# Patient Record
Sex: Female | Born: 1980 | Hispanic: Yes | Marital: Married | Smoking: Never smoker
Health system: Southern US, Community
[De-identification: ages and names within clinical notes are randomized; demographics above are authoritative.]

## PROBLEM LIST (undated history)

## (undated) ENCOUNTER — Inpatient Hospital Stay (HOSPITAL_COMMUNITY): Payer: Self-pay

## (undated) DIAGNOSIS — O4100X0 Oligohydramnios, unspecified trimester, not applicable or unspecified: Secondary | ICD-10-CM

## (undated) HISTORY — PX: NO PAST SURGERIES: SHX2092

---

## 2008-10-06 ENCOUNTER — Ambulatory Visit: Payer: Self-pay | Admitting: Obstetrics & Gynecology

## 2008-10-06 LAB — CONVERTED CEMR LAB
AntiThromb III Func: 114 %
Anticardiolipin IgA: 7
Anticardiolipin IgG: <7
Anticardiolipin IgM: <7
Homocysteine: 1.5 umol/L — ABNORMAL LOW
Protein S Ag, Total: 69 % — ABNORMAL LOW

## 2008-10-10 ENCOUNTER — Ambulatory Visit (HOSPITAL_COMMUNITY): Admission: RE | Admit: 2008-10-10 | Discharge: 2008-10-10 | Payer: Self-pay | Admitting: Family Medicine

## 2008-10-20 ENCOUNTER — Ambulatory Visit: Payer: Self-pay | Admitting: Family Medicine

## 2008-11-03 ENCOUNTER — Ambulatory Visit: Payer: Self-pay | Admitting: Obstetrics & Gynecology

## 2008-11-17 ENCOUNTER — Ambulatory Visit: Payer: Self-pay | Admitting: Family Medicine

## 2008-12-01 ENCOUNTER — Ambulatory Visit: Payer: Self-pay | Admitting: Obstetrics & Gynecology

## 2008-12-15 ENCOUNTER — Other Ambulatory Visit: Payer: Self-pay | Admitting: Obstetrics & Gynecology

## 2008-12-22 ENCOUNTER — Ambulatory Visit (HOSPITAL_COMMUNITY): Admission: RE | Admit: 2008-12-22 | Discharge: 2008-12-22 | Payer: Self-pay | Admitting: Obstetrics and Gynecology

## 2008-12-22 ENCOUNTER — Ambulatory Visit: Payer: Self-pay | Admitting: Family Medicine

## 2008-12-22 ENCOUNTER — Encounter: Payer: Self-pay | Admitting: Obstetrics & Gynecology

## 2008-12-22 LAB — CONVERTED CEMR LAB
HCT: 33.5 % — ABNORMAL LOW (ref 36.0–46.0)
MCHC: 34 g/dL (ref 30.0–36.0)
MCV: 91.5 fL (ref 78.0–100.0)
Platelets: 227 10*3/uL (ref 150–400)
RDW: 12.9 % (ref 11.5–15.5)

## 2009-01-05 ENCOUNTER — Ambulatory Visit: Payer: Self-pay | Admitting: Family Medicine

## 2009-01-16 ENCOUNTER — Ambulatory Visit: Payer: Self-pay | Admitting: Obstetrics & Gynecology

## 2009-01-19 ENCOUNTER — Ambulatory Visit: Payer: Self-pay | Admitting: Family Medicine

## 2009-01-23 ENCOUNTER — Ambulatory Visit: Payer: Self-pay | Admitting: Family Medicine

## 2009-01-26 ENCOUNTER — Ambulatory Visit: Payer: Self-pay | Admitting: Obstetrics & Gynecology

## 2009-01-30 ENCOUNTER — Inpatient Hospital Stay (HOSPITAL_COMMUNITY): Admission: AD | Admit: 2009-01-30 | Discharge: 2009-01-30 | Payer: Self-pay | Admitting: Family Medicine

## 2009-01-31 ENCOUNTER — Ambulatory Visit: Payer: Self-pay | Admitting: Obstetrics and Gynecology

## 2009-02-02 ENCOUNTER — Ambulatory Visit: Payer: Self-pay | Admitting: Family Medicine

## 2009-02-06 ENCOUNTER — Ambulatory Visit: Payer: Self-pay | Admitting: Obstetrics & Gynecology

## 2009-02-09 ENCOUNTER — Ambulatory Visit: Payer: Self-pay | Admitting: Family Medicine

## 2009-02-14 ENCOUNTER — Ambulatory Visit: Payer: Self-pay | Admitting: Obstetrics & Gynecology

## 2009-02-16 ENCOUNTER — Ambulatory Visit: Payer: Self-pay | Admitting: Obstetrics & Gynecology

## 2009-02-17 ENCOUNTER — Encounter: Payer: Self-pay | Admitting: Obstetrics & Gynecology

## 2009-02-20 ENCOUNTER — Ambulatory Visit: Payer: Self-pay | Admitting: Obstetrics & Gynecology

## 2009-02-23 ENCOUNTER — Ambulatory Visit: Payer: Self-pay | Admitting: Advanced Practice Midwife

## 2009-02-23 ENCOUNTER — Ambulatory Visit: Payer: Self-pay | Admitting: Obstetrics & Gynecology

## 2009-02-23 ENCOUNTER — Inpatient Hospital Stay (HOSPITAL_COMMUNITY): Admission: AD | Admit: 2009-02-23 | Discharge: 2009-02-25 | Payer: Self-pay | Admitting: Family Medicine

## 2009-02-23 LAB — CONVERTED CEMR LAB
Chlamydia, DNA Probe: NEGATIVE
GC Probe Amp, Genital: NEGATIVE

## 2009-02-24 ENCOUNTER — Encounter: Payer: Self-pay | Admitting: Obstetrics & Gynecology

## 2009-02-24 LAB — CONVERTED CEMR LAB: Clue Cells Wet Prep HPF POC: NONE SEEN

## 2010-12-24 LAB — CBC
Hemoglobin: 12.5 g/dL (ref 12.0–15.0)
RBC: 3.77 MIL/uL — ABNORMAL LOW (ref 3.87–5.11)
RDW: 12.9 % (ref 11.5–15.5)

## 2010-12-24 LAB — POCT URINALYSIS DIP (DEVICE)
Bilirubin Urine: NEGATIVE
Glucose, UA: NEGATIVE mg/dL
Glucose, UA: NEGATIVE mg/dL
Nitrite: NEGATIVE
Nitrite: NEGATIVE
Urobilinogen, UA: 0.2 mg/dL (ref 0.0–1.0)
Urobilinogen, UA: 0.2 mg/dL (ref 0.0–1.0)
pH: 7.5 (ref 5.0–8.0)

## 2010-12-24 LAB — RPR: RPR Ser Ql: NONREACTIVE

## 2010-12-25 LAB — POCT URINALYSIS DIP (DEVICE)
Bilirubin Urine: NEGATIVE
Bilirubin Urine: NEGATIVE
Bilirubin Urine: NEGATIVE
Bilirubin Urine: NEGATIVE
Glucose, UA: NEGATIVE mg/dL
Glucose, UA: NEGATIVE mg/dL
Glucose, UA: NEGATIVE mg/dL
Glucose, UA: NEGATIVE mg/dL
Hgb urine dipstick: NEGATIVE
Hgb urine dipstick: NEGATIVE
Ketones, ur: NEGATIVE mg/dL
Ketones, ur: NEGATIVE mg/dL
Ketones, ur: NEGATIVE mg/dL
Nitrite: NEGATIVE
Nitrite: NEGATIVE
Protein, ur: NEGATIVE mg/dL
Specific Gravity, Urine: 1.015 (ref 1.005–1.030)
Specific Gravity, Urine: 1.02 (ref 1.005–1.030)
pH: 7 (ref 5.0–8.0)

## 2010-12-26 LAB — POCT URINALYSIS DIP (DEVICE)
Bilirubin Urine: NEGATIVE
Bilirubin Urine: NEGATIVE
Glucose, UA: NEGATIVE mg/dL
Hgb urine dipstick: NEGATIVE
Hgb urine dipstick: NEGATIVE
Ketones, ur: NEGATIVE mg/dL
Ketones, ur: NEGATIVE mg/dL
Nitrite: NEGATIVE
Protein, ur: NEGATIVE mg/dL
Protein, ur: NEGATIVE mg/dL
Specific Gravity, Urine: 1.015 (ref 1.005–1.030)
Specific Gravity, Urine: 1.015 (ref 1.005–1.030)
Specific Gravity, Urine: 1.02 (ref 1.005–1.030)
Urobilinogen, UA: 0.2 mg/dL (ref 0.0–1.0)
pH: 8.5 — ABNORMAL HIGH (ref 5.0–8.0)
pH: 6 (ref 5.0–8.0)

## 2010-12-27 LAB — POCT URINALYSIS DIP (DEVICE)
Bilirubin Urine: NEGATIVE
Bilirubin Urine: NEGATIVE
Glucose, UA: NEGATIVE mg/dL
Hgb urine dipstick: NEGATIVE
Hgb urine dipstick: NEGATIVE
Ketones, ur: NEGATIVE mg/dL
Nitrite: NEGATIVE
Protein, ur: NEGATIVE mg/dL
Specific Gravity, Urine: 1.015 (ref 1.005–1.030)
pH: 8.5 — ABNORMAL HIGH (ref 5.0–8.0)
pH: 7 (ref 5.0–8.0)

## 2010-12-31 LAB — POCT URINALYSIS DIP (DEVICE)
Hgb urine dipstick: NEGATIVE
Ketones, ur: NEGATIVE mg/dL
Protein, ur: 30 mg/dL — AB
Specific Gravity, Urine: 1.015 (ref 1.005–1.030)
Urobilinogen, UA: 0.2 mg/dL (ref 0.0–1.0)
pH: 8.5 — ABNORMAL HIGH (ref 5.0–8.0)

## 2011-01-01 LAB — POCT URINALYSIS DIP (DEVICE)
Bilirubin Urine: NEGATIVE
Hgb urine dipstick: NEGATIVE
Hgb urine dipstick: NEGATIVE
Nitrite: NEGATIVE
Nitrite: NEGATIVE
Protein, ur: NEGATIVE mg/dL
Protein, ur: 30 mg/dL — AB
Specific Gravity, Urine: 1.02 (ref 1.005–1.030)
Urobilinogen, UA: 0.2 mg/dL (ref 0.0–1.0)
Urobilinogen, UA: 1 mg/dL (ref 0.0–1.0)
pH: 8.5 — ABNORMAL HIGH (ref 5.0–8.0)
pH: 8.5 — ABNORMAL HIGH (ref 5.0–8.0)

## 2011-01-03 IMAGING — US US OB FOLLOW-UP
1 series · 14 of 28 positions shown · non-contrast
Comparison: none

OBSTETRICAL ULTRASOUND:
 This ultrasound exam was performed in the [HOSPITAL] Ultrasound Department.  The OB US report was generated in the AS system, and faxed to the ordering physician.  This report is also available in [REDACTED] PACS.

[Series 1: us ob follow up · 14 of 49 slices shown]
[im 2/49]
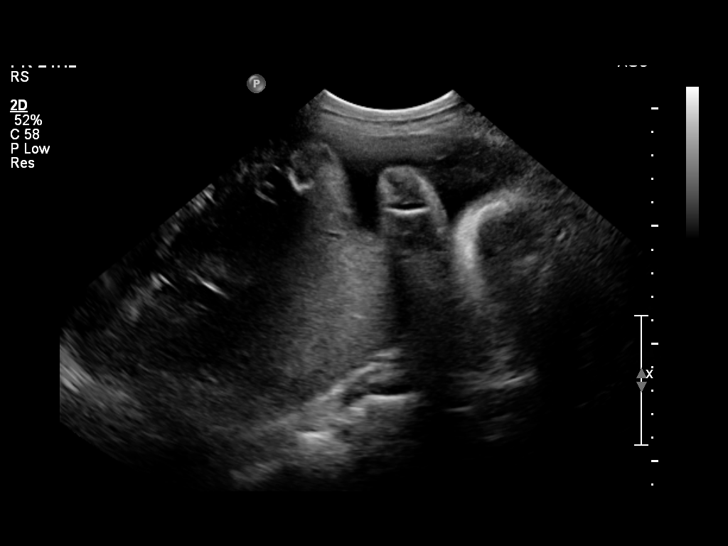
[im 6/49]
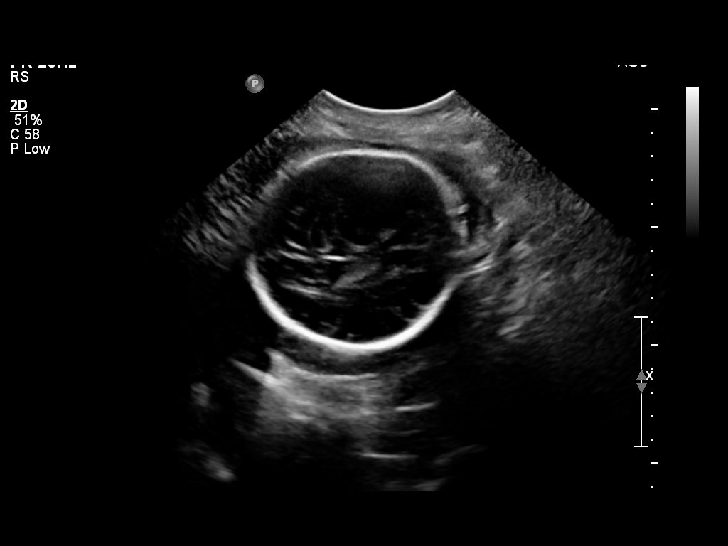
[im 9/49]
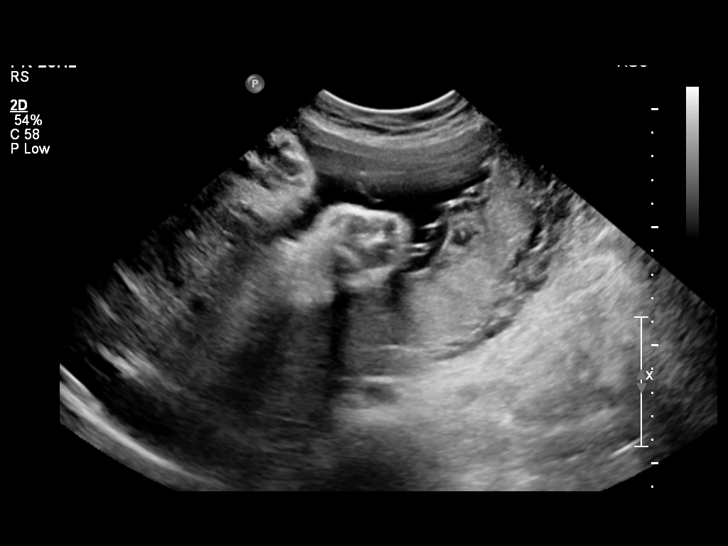
[im 13/49]
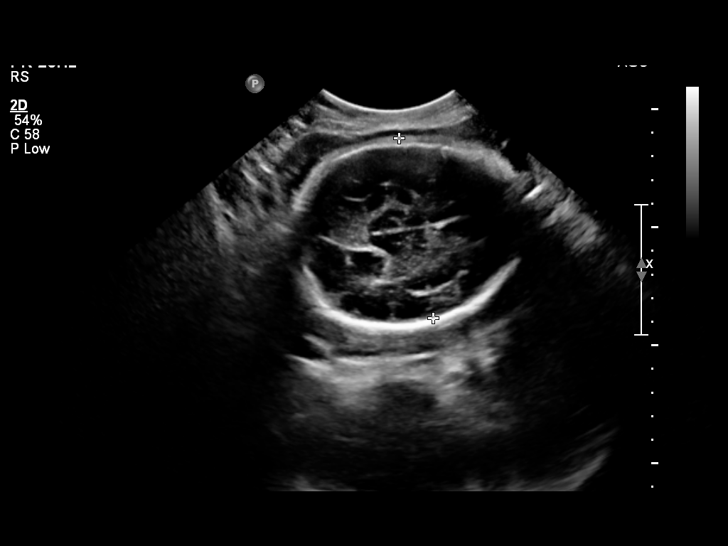
[im 17/49]
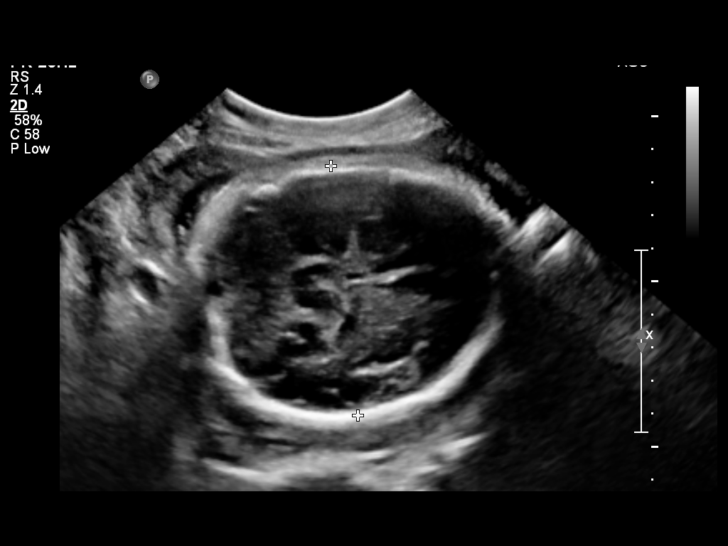
[im 20/49]
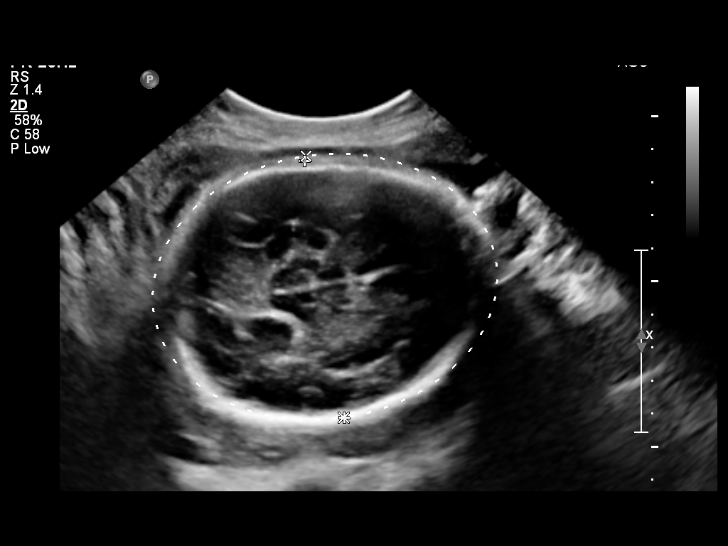
[im 24/49]
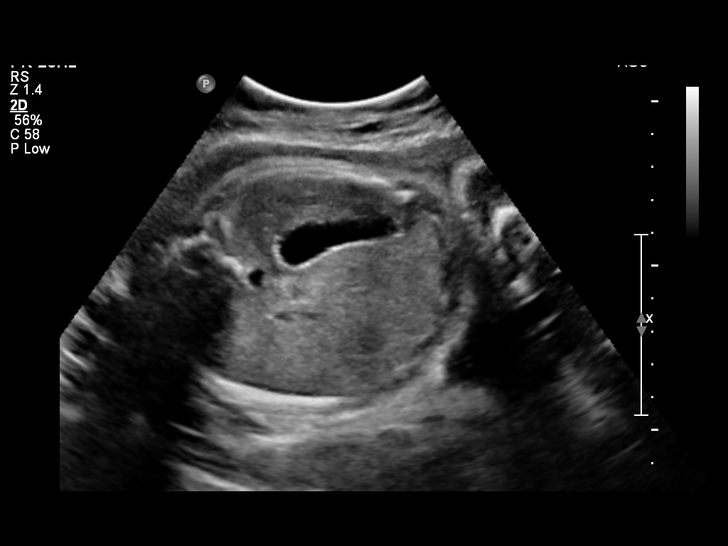
[im 27/49]
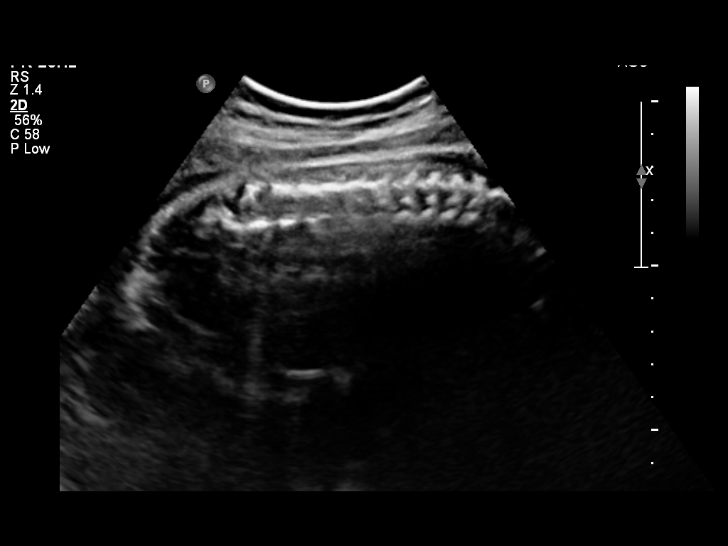
[im 31/49]
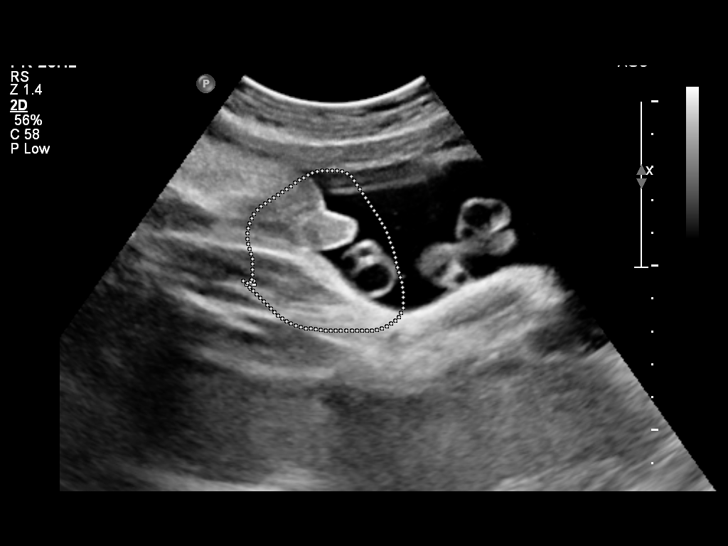
[im 34/49]
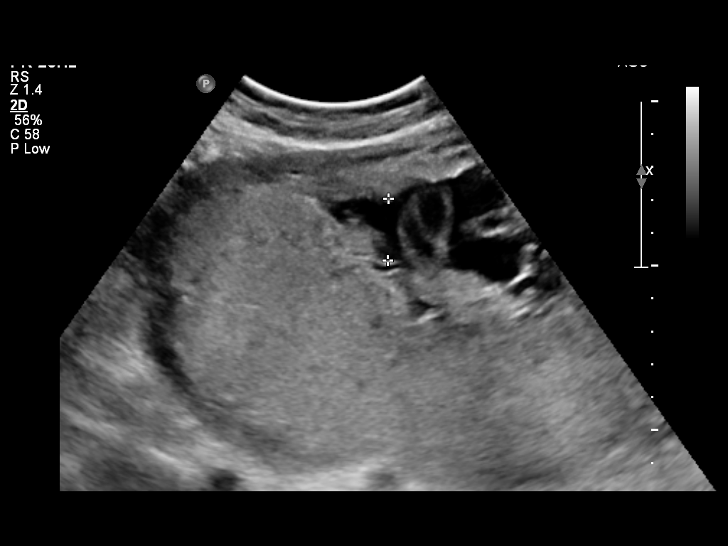
[im 38/49]
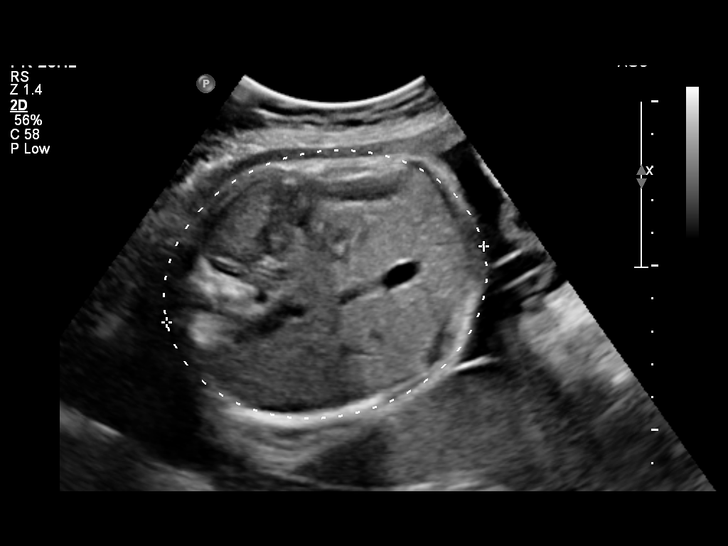
[im 41/49]
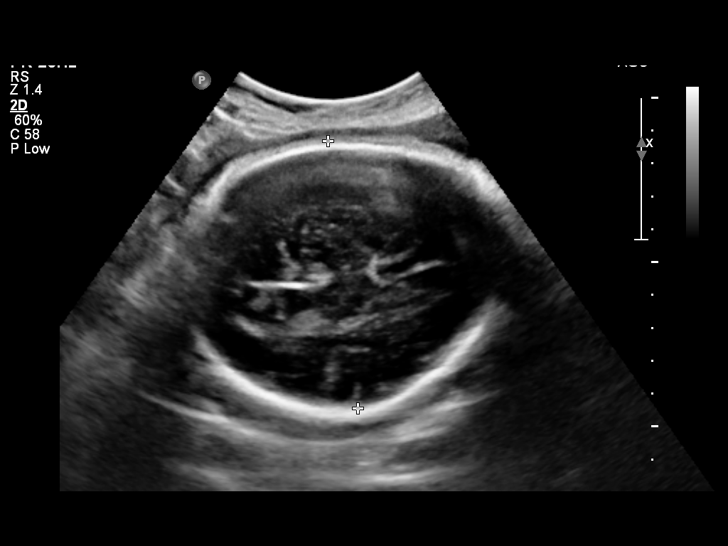
[im 45/49]
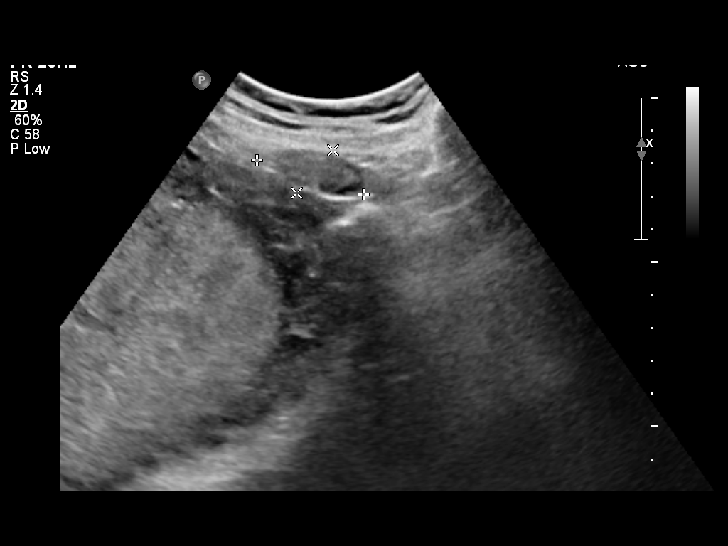
[im 49/49]
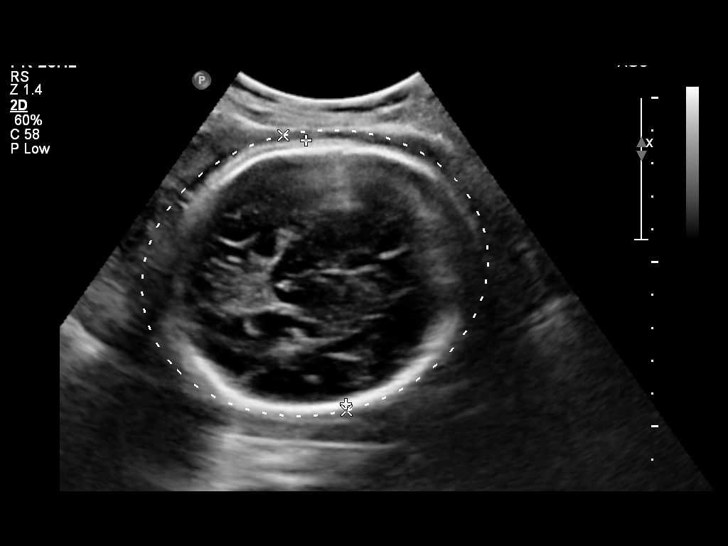

[14 of 28 positions shown; findings below may reference images not displayed]

IMPRESSION: See AS Obstetric US report.

## 2013-09-16 NOTE — L&D Delivery Note (Signed)
Delivery Note At 3:15 AM a healthy female was delivered via Vaginal, Spontaneous Delivery (Presentation: ;  ).  APGAR: 9, 9; weight .   Placenta status: Intact, Spontaneous.  Cord: 3 vessels with the following complications: None.  Cord pH: not obtained  Anesthesia: Local  Episiotomy: None Lacerations: Right periuretheral  Suture Repair: vicryl rapide Est. Blood Loss (mL): 250   Upon arrival patient was complete and pushing. She pushed with good maternal effort to deliver a healthy boy. Mild shoulder dystocia noted with McRoberts only to aid delivery.  Baby was noted to have good tone and placed on maternal abdomen for drying and stimulation. Delayed cord clamping performed and cut by FOB. Placenta delivered intact with 3V cord. Vaginal canal and perineum was inspected and superficial right periurethral laceration was found and repaired in the usual fashion by Dr Gayla DossJoyner which was hemostatic on completion. Pitocin was started and uterus massaged until bleeding slowed. Counts of sharps, instruments, and lap pads were all correct.    Mom to postpartum.  Baby to Couplet care / Skin to Skin.  [name], James 05/03/2014, 3:43 AM  I was present for this delivery and agree with the above resident's note.  Sharen CounterLisa [name] Certified Nurse-Midwife

## 2014-01-19 ENCOUNTER — Encounter: Payer: Self-pay | Admitting: *Deleted

## 2014-01-31 ENCOUNTER — Ambulatory Visit (INDEPENDENT_AMBULATORY_CARE_PROVIDER_SITE_OTHER): Payer: Self-pay | Admitting: Obstetrics & Gynecology

## 2014-01-31 ENCOUNTER — Encounter: Payer: Self-pay | Admitting: Obstetrics & Gynecology

## 2014-01-31 VITALS — BP 107/70 | HR 78 | Temp 96.9°F | Ht 62.0 in | Wt 128.1 lb

## 2014-01-31 DIAGNOSIS — O09529 Supervision of elderly multigravida, unspecified trimester: Secondary | ICD-10-CM

## 2014-01-31 DIAGNOSIS — O09299 Supervision of pregnancy with other poor reproductive or obstetric history, unspecified trimester: Secondary | ICD-10-CM

## 2014-01-31 DIAGNOSIS — O09522 Supervision of elderly multigravida, second trimester: Secondary | ICD-10-CM

## 2014-01-31 DIAGNOSIS — Z8759 Personal history of other complications of pregnancy, childbirth and the puerperium: Secondary | ICD-10-CM | POA: Insufficient documentation

## 2014-01-31 LAB — POCT URINALYSIS DIP (DEVICE)
BILIRUBIN URINE: NEGATIVE
Glucose, UA: NEGATIVE mg/dL
HGB URINE DIPSTICK: NEGATIVE
Ketones, ur: NEGATIVE mg/dL
NITRITE: NEGATIVE
PH: 7.5 (ref 5.0–8.0)
PROTEIN: NEGATIVE mg/dL
Specific Gravity, Urine: 1.02 (ref 1.005–1.030)
Urobilinogen, UA: 0.2 mg/dL (ref 0.0–1.0)

## 2014-01-31 NOTE — Progress Notes (Signed)
Patient reports pelvic pain; had a couple episodes of light bleeding earlier in pregnancy

## 2014-01-31 NOTE — Progress Notes (Signed)
New OB late prenatal care. Pap done  Subjective:late PNC h/o stillbirth    [name] is a [id number] being seen today for her first obstetrical visit.  Her obstetrical history is significant for advanced maternal age and stillbirth first pregnancy. Patient does intend to breast feed. Pregnancy history fully reviewed.  Patient reports no complaints.  Filed Vitals:   01/31/14 1048 01/31/14 1050  BP: 107/70   Pulse: 78   Temp: 96.9 F (36.1 C)   Height:  5\' 2"  (1.575 m)  Weight: 128 lb 1.6 oz (58.106 kg)     HISTORY: OB History  Gravida Para Term Preterm AB SAB TAB Ectopic Multiple Living  3 2 1 1  0 0 0 0 0 1    # Outcome Date GA Lbr Len/2nd Weight Sex Delivery Anes PTL Lv  3 CUR           2 TRM 02/23/09 [id number]  7 lb (3.175 kg) M SVD None  Y  1 PRE 2001 [id number]       SB     Comments: no fluid and nuchal cord     History reviewed. No pertinent past medical history. History reviewed. No pertinent past surgical history. History reviewed. No pertinent family history.   Exam    Uterus:  Fundal Height: 24 cm  Pelvic Exam:    Perineum: Normal Perineum   Vulva: normal   Vagina:  normal mucosa   pH:     Cervix: no lesions   Adnexa: not evaluated   Bony Pelvis: average  System: Breast:  normal appearance, no masses or tenderness   Skin: normal coloration and turgor, no rashes    Neurologic: oriented, normal   Extremities: normal strength, tone, and muscle mass   HEENT neck supple with midline trachea   Mouth/Teeth mucous membranes moist, pharynx normal without lesions and dental hygiene good   Neck supple   Cardiovascular: regular rate and rhythm, no murmurs or gallops   Respiratory:  appears well, vitals normal, no respiratory distress, acyanotic, normal RR, neck free of mass or lymphadenopathy, chest clear, no wheezing, crepitations, rhonchi, normal symmetric air entry   Abdomen: soft, non-tender; bowel sounds normal; no masses,  no organomegaly   Urinary:  urethral meatus normal      Assessment:    Pregnancy: [id number] Patient Active Problem List   Diagnosis Date Noted  . Supervision of high risk elderly multigravida in second trimester 01/31/2014    h/o stillbirth    Plan:     Initial labs drawn. Prenatal vitamins. Problem list reviewed and updated. Genetic Screening discussed too late   Ultrasound discussed; fetal survey: ordered.  Follow up in 2 weeks. 50% of 30 min visit spent on counseling and coordination of care.  BPP 28 weeks, NST 32 weeks   Adam PhenixJames G Shamecka Hocutt 01/31/2014

## 2014-01-31 NOTE — Patient Instructions (Signed)
Second Trimester of Pregnancy The second trimester is from week 13 through week 28, months 4 through 6. The second trimester is often a time when you feel your best. Your body has also adjusted to being pregnant, and you begin to feel better physically. Usually, morning sickness has lessened or quit completely, you may have more energy, and you may have an increase in appetite. The second trimester is also a time when the fetus is growing rapidly. At the end of the sixth month, the fetus is about 9 inches long and weighs about 1 pounds. You will likely begin to feel the baby move (quickening) between 18 and 20 weeks of the pregnancy. BODY CHANGES Your body goes through many changes during pregnancy. The changes vary from woman to woman.   Your weight will continue to increase. You will notice your lower abdomen bulging out.  You may begin to get stretch marks on your hips, abdomen, and breasts.  You may develop headaches that can be relieved by medicines approved by your caregiver.  You may urinate more often because the fetus is pressing on your bladder.  You may develop or continue to have heartburn as a result of your pregnancy.  You may develop constipation because certain hormones are causing the muscles that push waste through your intestines to slow down.  You may develop hemorrhoids or swollen, bulging veins (varicose veins).  You may have back pain because of the weight gain and pregnancy hormones relaxing your joints between the bones in your pelvis and as a result of a shift in weight and the muscles that support your balance.  Your breasts will continue to grow and be tender.  Your gums may bleed and may be sensitive to brushing and flossing.  Dark spots or blotches (chloasma, mask of pregnancy) may develop on your face. This will likely fade after the baby is born.  A dark line from your belly button to the pubic area (linea nigra) may appear. This will likely fade after the  baby is born. WHAT TO EXPECT AT YOUR PRENATAL VISITS During a routine prenatal visit:  You will be weighed to make sure you and the fetus are growing normally.  Your blood pressure will be taken.  Your abdomen will be measured to track your baby's growth.  The fetal heartbeat will be listened to.  Any test results from the previous visit will be discussed. Your caregiver may ask you:  How you are feeling.  If you are feeling the baby move.  If you have had any abnormal symptoms, such as leaking fluid, bleeding, severe headaches, or abdominal cramping.  If you have any questions. Other tests that may be performed during your second trimester include:  Blood tests that check for:  Low iron levels (anemia).  Gestational diabetes (between 24 and 28 weeks).  Rh antibodies.  Urine tests to check for infections, diabetes, or protein in the urine.  An ultrasound to confirm the proper growth and development of the baby.  An amniocentesis to check for possible genetic problems.  Fetal screens for spina bifida and Down syndrome. HOME CARE INSTRUCTIONS   Avoid all smoking, herbs, alcohol, and unprescribed drugs. These chemicals affect the formation and growth of the baby.  Follow your caregiver's instructions regarding medicine use. There are medicines that are either safe or unsafe to take during pregnancy.  Exercise only as directed by your caregiver. Experiencing uterine cramps is a good sign to stop exercising.  Continue to eat regular,   healthy meals.  Wear a good support bra for breast tenderness.  Do not use hot tubs, steam rooms, or saunas.  Wear your seat belt at all times when driving.  Avoid raw meat, uncooked cheese, cat litter boxes, and soil used by cats. These carry germs that can cause birth defects in the baby.  Take your prenatal vitamins.  Try taking a stool softener (if your caregiver approves) if you develop constipation. Eat more high-fiber foods,  such as fresh vegetables or fruit and whole grains. Drink plenty of fluids to keep your urine clear or pale yellow.  Take warm sitz baths to soothe any pain or discomfort caused by hemorrhoids. Use hemorrhoid cream if your caregiver approves.  If you develop varicose veins, wear support hose. Elevate your feet for 15 minutes, 3 4 times a day. Limit salt in your diet.  Avoid heavy lifting, wear low heel shoes, and practice good posture.  Rest with your legs elevated if you have leg cramps or low back pain.  Visit your dentist if you have not gone yet during your pregnancy. Use a soft toothbrush to brush your teeth and be gentle when you floss.  A sexual relationship may be continued unless your caregiver directs you otherwise.  Continue to go to all your prenatal visits as directed by your caregiver. SEEK MEDICAL CARE IF:   You have dizziness.  You have mild pelvic cramps, pelvic pressure, or nagging pain in the abdominal area.  You have persistent nausea, vomiting, or diarrhea.  You have a bad smelling vaginal discharge.  You have pain with urination. SEEK IMMEDIATE MEDICAL CARE IF:   You have a fever.  You are leaking fluid from your vagina.  You have spotting or bleeding from your vagina.  You have severe abdominal cramping or pain.  You have rapid weight gain or loss.  You have shortness of breath with chest pain.  You notice sudden or extreme swelling of your face, hands, ankles, feet, or legs.  You have not felt your baby move in over an hour.  You have severe headaches that do not go away with medicine.  You have vision changes. Document Released: 08/27/2001 Document Revised: 05/05/2013 Document Reviewed: 11/03/2012 [employer] Patient Information 2014 [employer].  

## 2014-02-01 LAB — OBSTETRIC PANEL
Antibody Screen: NEGATIVE
Basophils Absolute: 0 10*3/uL (ref 0.0–0.1)
Basophils Relative: 0 % (ref 0–1)
EOS PCT: 1 % (ref 0–5)
Eosinophils Absolute: 0.1 10*3/uL (ref 0.0–0.7)
HEMATOCRIT: 34.8 % — AB (ref 36.0–46.0)
Hemoglobin: 12 g/dL (ref 12.0–15.0)
Hepatitis B Surface Ag: NEGATIVE
LYMPHS ABS: 1.5 10*3/uL (ref 0.7–4.0)
LYMPHS PCT: 22 % (ref 12–46)
MCH: 31.1 pg (ref 26.0–34.0)
MCHC: 34.5 g/dL (ref 30.0–36.0)
MCV: 90.2 fL (ref 78.0–100.0)
MONO ABS: 0.4 10*3/uL (ref 0.1–1.0)
Monocytes Relative: 6 % (ref 3–12)
NEUTROS ABS: 4.9 10*3/uL (ref 1.7–7.7)
Neutrophils Relative %: 71 % (ref 43–77)
PLATELETS: 247 10*3/uL (ref 150–400)
RBC: 3.86 MIL/uL — AB (ref 3.87–5.11)
RDW: 13.5 % (ref 11.5–15.5)
RUBELLA: 3.89 {index} — AB (ref <0.90)
Rh Type: POSITIVE
WBC: 6.9 10*3/uL (ref 4.0–10.5)

## 2014-02-01 LAB — HIV ANTIBODY (ROUTINE TESTING W REFLEX): HIV: NONREACTIVE

## 2014-02-02 LAB — HEMOGLOBINOPATHY EVALUATION
HGB A2 QUANT: 2.8 % (ref 2.2–3.2)
HGB A: 97.2 % (ref 96.8–97.8)
HGB F QUANT: 0 % (ref 0.0–2.0)
HGB S QUANTITAION: 0 %
Hemoglobin Other: 0 %

## 2014-02-02 LAB — PRESCRIPTION MONITORING PROFILE (19 PANEL)
AMPHETAMINE/METH: NEGATIVE ng/mL
BARBITURATE SCREEN, URINE: NEGATIVE ng/mL
Benzodiazepine Screen, Urine: NEGATIVE ng/mL
Buprenorphine, Urine: NEGATIVE ng/mL
CANNABINOID SCRN UR: NEGATIVE ng/mL
CARISOPRODOL, URINE: NEGATIVE ng/mL
CREATININE, URINE: 158.22 mg/dL (ref >20.0)
Cocaine Metabolites: NEGATIVE ng/mL
Fentanyl, Ur: NEGATIVE ng/mL
MDMA URINE: NEGATIVE ng/mL
METHADONE SCREEN, URINE: NEGATIVE ng/mL
Meperidine, Ur: NEGATIVE ng/mL
Methaqualone: NEGATIVE ng/mL
NITRITES URINE, INITIAL: NEGATIVE ug/mL
OPIATE SCREEN, URINE: NEGATIVE ng/mL
OXYCODONE SCRN UR: NEGATIVE ng/mL
PH URINE, INITIAL: 7.9 pH (ref 4.5–8.9)
PROPOXYPHENE: NEGATIVE ng/mL
Phencyclidine, Ur: NEGATIVE ng/mL
TAPENTADOLUR: NEGATIVE ng/mL
TRAMADOL UR: NEGATIVE ng/mL
Zolpidem, Urine: NEGATIVE ng/mL

## 2014-02-03 ENCOUNTER — Ambulatory Visit (HOSPITAL_COMMUNITY)
Admission: RE | Admit: 2014-02-03 | Discharge: 2014-02-03 | Disposition: A | Discharge status: Home / Self Care | Payer: Medicaid Other | Source: Ambulatory Visit | Attending: Obstetrics & Gynecology | Admitting: Obstetrics & Gynecology

## 2014-02-03 DIAGNOSIS — O09299 Supervision of pregnancy with other poor reproductive or obstetric history, unspecified trimester: Secondary | ICD-10-CM | POA: Insufficient documentation

## 2014-02-03 DIAGNOSIS — Z3689 Encounter for other specified antenatal screening: Secondary | ICD-10-CM | POA: Insufficient documentation

## 2014-02-05 LAB — CULTURE, OB URINE: Colony Count: >=100000

## 2014-02-08 ENCOUNTER — Telehealth: Payer: Self-pay | Admitting: *Deleted

## 2014-02-08 NOTE — Telephone Encounter (Signed)
Called home number and mobile number and left message we are calling with some information- please call clinic.

## 2014-02-08 NOTE — Telephone Encounter (Signed)
Message copied by Gerome Apley on Tue Feb 08, 2014 10:23 AM ------      Message from: Adam Phenix      Created: Tue Feb 08, 2014  9:16 AM       UTI Rx amoxicillin 500 mg TID 7 days ------

## 2014-02-09 ENCOUNTER — Telehealth: Payer: Self-pay | Admitting: *Deleted

## 2014-02-09 ENCOUNTER — Encounter: Payer: Self-pay | Admitting: *Deleted

## 2014-02-09 NOTE — Telephone Encounter (Signed)
Message left by pt on nurse voicemail however unable to understand what she was saying. I called back and left message stating that if she was returning our call from yesterday, a letter has been sent to her home explaining the reason for our call. There is no emergency and the letter will explain the nature of our call. **Pt has UTI and Rx has been e-prescribed to her pharmacy. Next clinic visit on 6/1 @ 0805.

## 2014-02-09 NOTE — Telephone Encounter (Signed)
Called [facility] home number and left message we are calling with some information- please call clinic. Also called mobile and left message. Will send letter. Next ob appt 02/14/14

## 2014-02-14 ENCOUNTER — Ambulatory Visit (INDEPENDENT_AMBULATORY_CARE_PROVIDER_SITE_OTHER): Payer: Self-pay | Admitting: Obstetrics & Gynecology

## 2014-02-14 VITALS — BP 104/59 | HR 89 | Temp 97.3°F | Wt 132.0 lb

## 2014-02-14 DIAGNOSIS — O09529 Supervision of elderly multigravida, unspecified trimester: Secondary | ICD-10-CM

## 2014-02-14 DIAGNOSIS — O09522 Supervision of elderly multigravida, second trimester: Secondary | ICD-10-CM

## 2014-02-14 LAB — POCT URINALYSIS DIP (DEVICE)
BILIRUBIN URINE: NEGATIVE
Glucose, UA: NEGATIVE mg/dL
HGB URINE DIPSTICK: NEGATIVE
Ketones, ur: NEGATIVE mg/dL
LEUKOCYTES UA: NEGATIVE
NITRITE: NEGATIVE
PH: 7.5 (ref 5.0–8.0)
PROTEIN: NEGATIVE mg/dL
Specific Gravity, Urine: 1.02 (ref 1.005–1.030)
Urobilinogen, UA: 0.2 mg/dL (ref 0.0–1.0)

## 2014-02-14 MED ORDER — AMOXICILLIN 500 MG PO CAPS: 500.0000 mg | ORAL_CAPSULE | Freq: Three times a day (TID) | ORAL | Status: DC

## 2014-02-14 NOTE — Patient Instructions (Addendum)
Tercer trimestre del embarazo  (Third Trimester of Pregnancy) El tercer trimestre del embarazo abarca desde la semana 29 hasta la semana 42, desde el 7 mes hasta el 9. En este trimestre el feto se desarrolla muy rpidamente. Hacia el final del noveno mes, el beb que an no ha nacido mide alrededor de 20 pulgadas (45 cm) de largo y pesa entre 6 y 10 libras (2,700 y 4,500 kg).  CAMBIOS CORPORALES  Su organismo atravesar numerosos cambios durante el embarazo. Los cambios varan de una mujer a otra.   Seguir aumentando de peso. Es esperable que aumente entre 25 y 35 libras (11 16 kg) hacia el final del embarazo.  Podrn aparecer las primeras estras en las caderas, abdomen y mamas.  Tendr necesidad de orinar con ms frecuencia porque el feto baja hacia la pelvis y presiona en la vejiga.  Como consecuencia del embarazo, podr sentir acidez estomacal continuamente.  Podr estar constipada ya que ciertas hormonas hacen que los msculos que hacen progresar los desechos a travs de los intestinos trabajen ms lentamente.  Pueden aparecer hemorroides o abultarse e hincharse las venas (venas varicosas).  Podr sentir dolor plvico debido al aumento de peso ya que las hormonas del embarazo relajan las articulaciones entre los huesos de la pelvis. El dolor de espalda puede ser consecuencia de la exigencia de los msculos que soportan la postura.  Sus mamas seguirn desarrollndose y estarn ms sensibles. A veces sale una secrecin amarilla de las mamas, que se llama calostro.  El ombligo puede salir hacia afuera.  Podr sentir que le falta el aire debido a que se expande el tero.  Podr notar que el feto "baja" o que se siente ms bajo en el abdomen.  Podr tener una prdida de secrecin mucosa con sangre. Esto suele ocurrir entre unos pocos das y una semana antes del parto.  El cuello se vuelve delgado y blando (se borra) cerca de la fecha de parto. QU DEBE ESPERAR EN LAS CONSULTAS  PRENATALES  Le harn exmenes prenatales cada 2 semanas hasta la semana 36. A partir de ese momento le harn exmenes semanales. Durante una visita prenatal de rutina:   La pesarn para verificar que usted y el feto se encuentran dentro de los lmites normales.  Le tomarn la presin arterial.  Le medirn el abdomen para verificar el desarrollo del beb.  Escucharn los latidos fetales.  Se evaluarn los resultados de los estudios solicitados en visitas anteriores.  Le controlarn el cuello del tero cuando est prxima la fecha de parto para ver si se ha borrado. Alrededor de la semana 36 el mdico controlar el cuello del tero. Al mismo tiempo realizar un anlisis de las secreciones del tejido vaginal. Este examen es para determinar si hay un tipo de bacteria, estreptococo Grupo B. El mdico le explicar esto con ms detalle.  El mdico podr preguntarle:   Como le gustara que fuera el parto.  Cmo se siente.  Si siente los movimientos del beb.  Si tiene sntomas anormales, como prdida de lquido, sangrado, dolores de cabeza intenso o clicos abdominales.  Si tiene alguna duda. Otros estudios que podrn realizarse durante el tercer trimestre son:   Anlisis de sangre para controlar sus niveles de hierro (anemia).  Controles fetales para determinar su salud, el nivel de actividad y su desarrollo. Si tiene alguna enfermedad o si tuvo problemas durante el embarazo, le harn estudios. FALSO TRABAJO DE PARTO  Es posible que sienta contracciones pequeas e irregulares que finalmente   desaparecen. Se llaman contracciones de Braxton Hicks o falso trabajo de parto. Las contracciones pueden durar horas, das o an semanas antes de que el verdadero trabajo de parto se inicie. Si las contracciones tienen intervalos regulares, se intensifican o se hacen dolorosas, lo mejor es que la revise su mdico.  SIGNOS DE TRABAJO DE PARTO   Espasmos del tipo menstrual.  Contracciones cada 5  minutos o menos.  Contracciones que comienzan en la parte superior del tero y se expanden hacia abajo, a la zona inferior del abdomen y la espalda.  Sensacin de presin que aumenta en la pelvis o dolor en la espalda.  Aparece una secrecin acuosa o sanguinolenta por la vagina. Si tiene alguno de estos signos antes de la semana 37 del embarazo, llame a su mdico inmediatamente. Debe concurrir al hospital para ser controlada inmediatamente.  INSTRUCCIONES PARA EL CUIDADO EN EL HOGAR   Evite fumar, consumir hierbas, beber alcohol y utilizar frmacos que no le hayan recetado. Estas sustancias qumicas afectan la formacin y el desarrollo del beb.  Siga las indicaciones del profesional con respecto a como tomar los medicamentos. Durante el embarazo, hay medicamentos que son seguros y otros no lo son.  Realice actividad fsica slo segn las indicaciones del mdico. Sentir clicos uterinos es el mejor signo para detener la actividad fsica.  Contine haciendo comidas regulares y sanas.  Use un sostn que le brinde buen soporte si sus mamas estn sensibles.  No utilice la baera con agua caliente, baos turcos o saunas.  Colquese el cinturn de seguridad cuando conduzca.  Evite comer carne cruda queso sin cocinar y el contacto con los utensilios y desperdicios de los gatos. Estos elementos contienen grmenes que pueden causar defectos de nacimiento en el beb.  Tome las vitaminas indicadas para la etapa prenatal.  Pruebe un laxante (si el mdico la autoriza) si tiene constipacin. Consuma ms alimentos ricos en fibra, como vegetales y frutas frescos y cereales enteros. Beba gran cantidad de lquido para mantener la orina de tono claro o amarillo plido.  Tome baos de agua tibia para calmar el dolor o las molestias causadas por las hemorroides. Use una crema para las hemorroides si el mdico la autoriza.  Si tiene venas varicosas, use medias de soporte. Eleve los pies durante 15 minutos,  3 4 veces por da. Limite el consumo de sal en su dieta.  Evite levantar objetos pesados, use zapatos de tacones bajos y mantenga una buena postura.  Descanse con las piernas elevadas si tiene calambres o dolor de cintura.  Visite a su dentista si no lo ha hecho durante el embarazo. Use un cepillo de dientes blando para higienizarse los dientes y use suavemente el hilo dental.  Puede continuar su vida sexual excepto que el mdico le indique otra cosa.  No haga viajes largos excepto que sea absolutamente necesario y slo con la aprobacin de su mdico.  Tome clases prenatales para entender, practicar y hacer preguntas sobre el trabajo de parto y el alumbramiento.  Haga un ensayo sobre la partida al hospital.  Prepare el bolso que llevar al hospital.  Prepare la habitacin del beb.  Contine concurriendo a todas las visitas prenatales segn las indicaciones de su mdico. SOLICITE ATENCIN MDICA SI:   No est segura si est en trabajo de parto o ha roto la bolsa de aguas.  Tiene mareos.  Siente clicos leves, presin en la pelvis o dolor persistente en el abdomen.  Tiene nuseas o vmitos persistentes.  Observa una   secrecin vaginal con mal olor.  Siente dolor al orinar. SOLICITE ATENCIN MDICA DE INMEDIATO SI:   Tiene fiebre.  Pierde lquido o sangre por la vagina.  Tiene sangrado o pequeas prdidas vaginales.  Siente dolor intenso o clicos en el abdomen.  Sube o baja de peso rpidamente.  Le falta el aire y le duele el pecho al respirar.  Sbitamente se le hincha el rostro, las manos, los tobillos, los pies o las piernas de manera extrema.  No ha sentido los movimientos del beb durante una hora.  Siente un dolor de cabeza intenso que no se alivia con medicamentos.  Su visin se modifica. Document Released: 06/12/2005 Document Revised: 05/05/2013 [employer] Patient Information 2014 [employer].  

## 2014-02-14 NOTE — Progress Notes (Signed)
C/o of intermittent, mild lower pelvic pressure.  C/o of right lower leg cramping a couple days ago and now has bruise at site; states it was "pretty severe pain."  1hr gtt today; all other labs drawn at last visit.

## 2014-02-14 NOTE — Progress Notes (Signed)
Normal fetal anatomy US at 26 weeks, boy. BPP weekly starting 28 weeks

## 2014-02-14 NOTE — Progress Notes (Signed)
BPP scheduled 02/15/14 at 4 pm.

## 2014-02-15 ENCOUNTER — Ambulatory Visit (HOSPITAL_COMMUNITY): Payer: Self-pay

## 2014-02-15 LAB — GLUCOSE TOLERANCE, 1 HOUR (50G) W/O FASTING: Glucose, 1 Hour GTT: 117 mg/dL (ref 70–140)

## 2014-02-16 ENCOUNTER — Ambulatory Visit (HOSPITAL_COMMUNITY)
Admission: RE | Admit: 2014-02-16 | Discharge: 2014-02-16 | Disposition: A | Discharge status: Home / Self Care | Payer: Medicaid Other | Source: Ambulatory Visit | Attending: Obstetrics & Gynecology | Admitting: Obstetrics & Gynecology

## 2014-02-16 DIAGNOSIS — Z3689 Encounter for other specified antenatal screening: Secondary | ICD-10-CM | POA: Insufficient documentation

## 2014-02-16 DIAGNOSIS — O09522 Supervision of elderly multigravida, second trimester: Secondary | ICD-10-CM

## 2014-02-16 DIAGNOSIS — O09529 Supervision of elderly multigravida, unspecified trimester: Secondary | ICD-10-CM | POA: Insufficient documentation

## 2014-02-17 ENCOUNTER — Telehealth: Payer: Self-pay | Admitting: *Deleted

## 2014-02-17 DIAGNOSIS — O09299 Supervision of pregnancy with other poor reproductive or obstetric history, unspecified trimester: Secondary | ICD-10-CM

## 2014-02-17 NOTE — Telephone Encounter (Signed)
Made appointments for weekly BPP for  3 weeks for 29 wk , 30 wk, 31 wk.  02/23/14 11:15, 03/02/14 11:15, 03/09/14 11:15. Called patient with [name] at both home and mobile number and left messages we are calling with some information, please call clinics.

## 2014-02-17 NOTE — Telephone Encounter (Signed)
Message copied by Gerome Apley on Thu Feb 17, 2014 10:41 AM ------      Message from: Raynald Blend L      Created: Wed Feb 16, 2014 11:42 AM       Pt. Called front desk , said supposed to schedule BPP - wasn't sure where - front desk told her is done in radiology, we would call her back with appt.  Per chart review no order for weekly bpp- [place] results from today not available yet ------

## 2014-02-21 NOTE — Telephone Encounter (Signed)
Called patient at both numbers, no answer- left message on both lines that we are calling in regards to several appointments you have, please call us back at the clinics

## 2014-02-22 NOTE — Telephone Encounter (Signed)
Called patient and she stated that she already talked to someone about these appts and is aware. States she had to reschedule the one for tomorrow because she is unable to make it then. Patient had no further questions

## 2014-02-23 ENCOUNTER — Ambulatory Visit (HOSPITAL_COMMUNITY): Payer: Self-pay

## 2014-03-02 ENCOUNTER — Ambulatory Visit (HOSPITAL_COMMUNITY)
Admission: RE | Admit: 2014-03-02 | Discharge: 2014-03-02 | Disposition: A | Discharge status: Home / Self Care | Payer: Medicaid Other | Source: Ambulatory Visit | Attending: Obstetrics & Gynecology | Admitting: Obstetrics & Gynecology

## 2014-03-02 DIAGNOSIS — Z3689 Encounter for other specified antenatal screening: Secondary | ICD-10-CM | POA: Insufficient documentation

## 2014-03-02 DIAGNOSIS — O09299 Supervision of pregnancy with other poor reproductive or obstetric history, unspecified trimester: Secondary | ICD-10-CM

## 2014-03-09 ENCOUNTER — Ambulatory Visit (HOSPITAL_COMMUNITY)
Admission: RE | Admit: 2014-03-09 | Discharge: 2014-03-09 | Disposition: A | Discharge status: Home / Self Care | Payer: Medicaid Other | Source: Ambulatory Visit | Attending: Obstetrics & Gynecology | Admitting: Obstetrics & Gynecology

## 2014-03-09 DIAGNOSIS — Z3689 Encounter for other specified antenatal screening: Secondary | ICD-10-CM | POA: Insufficient documentation

## 2014-03-09 DIAGNOSIS — O09299 Supervision of pregnancy with other poor reproductive or obstetric history, unspecified trimester: Secondary | ICD-10-CM | POA: Insufficient documentation

## 2014-04-03 ENCOUNTER — Inpatient Hospital Stay (HOSPITAL_COMMUNITY)
Admission: AD | Admit: 2014-04-03 | Discharge: 2014-04-10 | DRG: 782 | Disposition: A | Discharge status: Home / Self Care | Payer: Medicaid Other | Source: Ambulatory Visit | Attending: Obstetrics & Gynecology | Admitting: Obstetrics & Gynecology

## 2014-04-03 ENCOUNTER — Encounter (HOSPITAL_COMMUNITY): Payer: Self-pay

## 2014-04-03 ENCOUNTER — Inpatient Hospital Stay (HOSPITAL_COMMUNITY): Payer: Medicaid Other

## 2014-04-03 DIAGNOSIS — O09299 Supervision of pregnancy with other poor reproductive or obstetric history, unspecified trimester: Secondary | ICD-10-CM

## 2014-04-03 DIAGNOSIS — O4693 Antepartum hemorrhage, unspecified, third trimester: Secondary | ICD-10-CM

## 2014-04-03 DIAGNOSIS — O09529 Supervision of elderly multigravida, unspecified trimester: Secondary | ICD-10-CM | POA: Diagnosis not present

## 2014-04-03 DIAGNOSIS — O4100X0 Oligohydramnios, unspecified trimester, not applicable or unspecified: Secondary | ICD-10-CM | POA: Diagnosis present

## 2014-04-03 DIAGNOSIS — O09293 Supervision of pregnancy with other poor reproductive or obstetric history, third trimester: Secondary | ICD-10-CM

## 2014-04-03 DIAGNOSIS — O4690 Antepartum hemorrhage, unspecified, unspecified trimester: Secondary | ICD-10-CM

## 2014-04-03 HISTORY — DX: Oligohydramnios, unspecified trimester, not applicable or unspecified: O41.00X0

## 2014-04-03 LAB — CBC
HCT: 32.4 % — ABNORMAL LOW (ref 36.0–46.0)
HEMOGLOBIN: 11.3 g/dL — AB (ref 12.0–15.0)
MCH: 31.3 pg (ref 26.0–34.0)
MCHC: 34.9 g/dL (ref 30.0–36.0)
MCV: 89.8 fL (ref 78.0–100.0)
Platelets: 234 10*3/uL (ref 150–400)
RBC: 3.61 MIL/uL — AB (ref 3.87–5.11)
RDW: 12.8 % (ref 11.5–15.5)
WBC: 7.1 10*3/uL (ref 4.0–10.5)

## 2014-04-03 LAB — RAPID URINE DRUG SCREEN, HOSP PERFORMED
AMPHETAMINES: NOT DETECTED
BARBITURATES: NOT DETECTED
Benzodiazepines: NOT DETECTED
Cocaine: NOT DETECTED
OPIATES: NOT DETECTED
TETRAHYDROCANNABINOL: NOT DETECTED

## 2014-04-03 LAB — URINALYSIS, ROUTINE W REFLEX MICROSCOPIC
BILIRUBIN URINE: NEGATIVE
Bilirubin Urine: NEGATIVE
Glucose, UA: NEGATIVE mg/dL
Glucose, UA: NEGATIVE mg/dL
Hgb urine dipstick: NEGATIVE
KETONES UR: NEGATIVE mg/dL
KETONES UR: NEGATIVE mg/dL
Leukocytes, UA: NEGATIVE
Leukocytes, UA: NEGATIVE
NITRITE: NEGATIVE
Nitrite: NEGATIVE
PROTEIN: NEGATIVE mg/dL
Protein, ur: NEGATIVE mg/dL
SPECIFIC GRAVITY, URINE: 1.02 (ref 1.005–1.030)
Specific Gravity, Urine: 1.015 (ref 1.005–1.030)
UROBILINOGEN UA: 1 mg/dL (ref 0.0–1.0)
Urobilinogen, UA: 2 mg/dL — ABNORMAL HIGH (ref 0.0–1.0)
pH: 6 (ref 5.0–8.0)
pH: 6.5 (ref 5.0–8.0)

## 2014-04-03 LAB — TYPE AND SCREEN
ABO/RH(D): O POS
Antibody Screen: NEGATIVE

## 2014-04-03 LAB — URINE MICROSCOPIC-ADD ON

## 2014-04-03 LAB — ABO/RH: ABO/RH(D): O POS

## 2014-04-03 MED ORDER — DOCUSATE SODIUM 100 MG PO CAPS
100.0000 mg | ORAL_CAPSULE | Freq: Every day | ORAL | Status: DC
  Administered 2014-04-07 – 2014-04-09 (×3): 100 mg via ORAL
  Filled 2014-04-03 (×6): qty 1

## 2014-04-03 MED ORDER — PRENATAL MULTIVITAMIN CH
1.0000 | ORAL_TABLET | Freq: Every day | ORAL | Status: DC
  Administered 2014-04-03 – 2014-04-09 (×7): 1 via ORAL
  Filled 2014-04-03 (×9): qty 1

## 2014-04-03 MED ORDER — ACETAMINOPHEN 325 MG PO TABS: 650.0000 mg | ORAL_TABLET | ORAL | Status: DC | PRN

## 2014-04-03 MED ORDER — ZOLPIDEM TARTRATE 5 MG PO TABS: 5.0000 mg | ORAL_TABLET | Freq: Every evening | ORAL | Status: DC | PRN

## 2014-04-03 MED ORDER — CALCIUM CARBONATE ANTACID 500 MG PO CHEW
2.0000 | CHEWABLE_TABLET | ORAL | Status: DC | PRN
  Filled 2014-04-03: qty 2

## 2014-04-03 NOTE — H&P (Signed)
[name] is a 33 y.o. female presenting for vaginal bleeding. Maternal Medical History:  Reason for admission: Vaginal bleeding.   Contractions: Onset was 1-2 hours ago.    Fetal activity: Perceived fetal activity is normal.   Last perceived fetal movement was within the past hour.      OB History   Grav Para Term Preterm Abortions TAB SAB Ect Mult Living   3 2 1 1  0 0 0 0 0 1     Past Medical History  Diagnosis Date  . Oligohydramnios    History reviewed. No pertinent past surgical history. Family History: family history is not on file. Social History:  reports that she has never smoked. She has never used smokeless tobacco. She reports that she does not drink alcohol or use illicit drugs.   Prenatal Transfer Tool  Maternal Diabetes: No Genetic Screening: Declined Maternal Ultrasounds/Referrals: Normal Fetal Ultrasounds or other Referrals:  None Maternal Substance Abuse:  No Significant Maternal Medications:  None Significant Maternal Lab Results:  None Other Comments:  None  Review of Systems  Constitutional: Negative.   HENT: Negative.   Eyes: Negative.   Respiratory: Negative.   Cardiovascular: Negative.   Gastrointestinal: Negative.   Genitourinary:       Vaginal bleeding at 34.[redacted] wks gestation.  Musculoskeletal: Negative.   Skin: Negative.   Neurological: Negative.   Endo/Heme/Allergies: Negative.       Blood pressure 111/64, pulse 84, temperature 98 F (36.7 C), temperature source Oral, resp. rate 18, last menstrual period 08/03/2013. Maternal Exam:  Uterine Assessment: Uterus soft and non tender.  Abdomen: Patient reports no abdominal tenderness. Fetal presentation: vertex  Introitus: Normal vagina.    Physical Exam  Constitutional: She is oriented to person, place, and time. She appears well-developed and well-nourished.  HENT:  Head: Normocephalic.  Eyes: Pupils are equal, round, and reactive to light.  Neck: Normal range of motion.   Cardiovascular: Normal rate, regular rhythm, normal heart sounds and intact distal pulses.   Respiratory: Effort normal and breath sounds normal.  GI: Soft. Bowel sounds are normal.  Genitourinary: Vagina normal and uterus normal.  SVE no active bleeding cervix 1/post/th/high.  Musculoskeletal: Normal range of motion.  Neurological: She is alert and oriented to person, place, and time. She has normal reflexes.  Skin: Skin is warm and dry.  Psychiatric: She has a normal mood and affect. Her behavior is normal. Judgment and thought content normal.    Prenatal labs: ABO, Rh: O/POS/-- (05/18 1216) Antibody: NEG (05/18 1216) Rubella: 3.89 (05/18 1216) RPR: NON REAC (05/18 1216)  HBsAg: NEGATIVE (05/18 1216)  HIV: NONREACTIVE (05/18 1216)  GBS:     Assessment/Plan: Vag bleeding at 34.5 wks with hx of stillborn birth. Will admit to antenatal for observation.   Wyvonnia DuskyLAWSON, [name] 04/03/2014, 1:37 PM

## 2014-04-03 NOTE — MAU Note (Signed)
Pt presents complaining of heavy vaginal bleeding that started around 1250 while in the shower. Denies other vaginal discharge or contractions. Reports good fetal movement.

## 2014-04-03 NOTE — MAU Note (Signed)
Report called to antenatal nurse. Will go to room 152

## 2014-04-04 MED ORDER — SODIUM CHLORIDE 0.9 % IJ SOLN
3.0000 mL | Freq: Two times a day (BID) | INTRAMUSCULAR | Status: DC
  Administered 2014-04-04 – 2014-04-06 (×6): 3 mL via INTRAVENOUS

## 2014-04-04 NOTE — Progress Notes (Signed)
Patient ID: [name], female   DOB: 03/20/1981, 33 y.o.   MRN: [id number] FACULTY PRACTICE ANTEPARTUM(COMPREHENSIVE) NOTE  [name] is a 33 y.o. G3P1101 at [redacted]w[redacted]d by LMP who is admitted for vaginal bleeding.   Fetal presentation is cephalic. Length of Stay:  1  Days  Subjective: No bleeding this AM but has seen some in toilet yesterday Patient reports the fetal movement as active. Patient reports uterine contraction  activity as none. Patient reports  vaginal bleeding as none. Patient describes fluid per vagina as None.  Vitals:  Blood pressure 102/61, pulse 84, temperature 97.9 F (36.6 C), temperature source Oral, resp. rate 18, height 5\' 2"  (1.575 m), weight 138 lb (62.596 kg), last menstrual period 08/03/2013. Physical Examination:  General appearance - alert, well appearing, and in no distress  Abdomen - soft, nontender, gravid Fundal Height:  size equals dates Cervical Exam: Not evaluated.  Extremities: extremities normal, atraumatic, no cyanosis or edema and Homans sign is negative, no sign of DVT  Membranes:intact  Fetal Monitoring:  Baseline: 135 bpm, Variability: Good {> 6 bpm), Accelerations: Reactive and Decelerations: Absent  Labs:  Results for orders placed during the hospital encounter of 04/03/14 (from the past 24 hour(s))  URINALYSIS, ROUTINE W REFLEX MICROSCOPIC   Collection Time    04/03/14  3:45 PM      Result Value Ref Range   Color, Urine YELLOW  YELLOW   APPearance CLEAR  CLEAR   Specific Gravity, Urine 1.015  1.005 - 1.030   pH 6.0  5.0 - 8.0   Glucose, UA NEGATIVE  NEGATIVE mg/dL   Hgb urine dipstick LARGE (*) NEGATIVE   Bilirubin Urine NEGATIVE  NEGATIVE   Ketones, ur NEGATIVE  NEGATIVE mg/dL   Protein, ur NEGATIVE  NEGATIVE mg/dL   Urobilinogen, UA 1.0  0.0 - 1.0 mg/dL   Nitrite NEGATIVE  NEGATIVE   Leukocytes, UA NEGATIVE  NEGATIVE  URINE RAPID DRUG SCREEN (HOSP PERFORMED)   Collection Time    04/03/14  3:45 PM      Result Value  Ref Range   Opiates NONE DETECTED  NONE DETECTED   Cocaine NONE DETECTED  NONE DETECTED   Benzodiazepines NONE DETECTED  NONE DETECTED   Amphetamines NONE DETECTED  NONE DETECTED   Tetrahydrocannabinol NONE DETECTED  NONE DETECTED   Barbiturates NONE DETECTED  NONE DETECTED  URINE MICROSCOPIC-ADD ON   Collection Time    04/03/14  3:45 PM      Result Value Ref Range   Squamous Epithelial / LPF FEW (*) RARE   WBC, UA 0-2  <3 WBC/hpf   RBC / HPF 3-6  <3 RBC/hpf   Bacteria, UA RARE  RARE  CBC   Collection Time    04/03/14  4:55 PM      Result Value Ref Range   WBC 7.1  4.0 - 10.5 K/uL   RBC 3.61 (*) 3.87 - 5.11 MIL/uL   Hemoglobin 11.3 (*) 12.0 - 15.0 g/dL   HCT 96.2 (*) 95.2 - 84.1 %   MCV 89.8  78.0 - 100.0 fL   MCH 31.3  26.0 - 34.0 pg   MCHC 34.9  30.0 - 36.0 g/dL   RDW 32.4  40.1 - 02.7 %   Platelets 234  150 - 400 K/uL  TYPE AND SCREEN   Collection Time    04/03/14  4:55 PM      Result Value Ref Range   ABO/RH(D) O POS     Antibody  Screen NEG     Sample Expiration 04/06/2014    ABO/RH   Collection Time    04/03/14  4:55 PM      Result Value Ref Range   ABO/RH(D) O POS    URINALYSIS, ROUTINE W REFLEX MICROSCOPIC   Collection Time    04/03/14  6:53 PM      Result Value Ref Range   Color, Urine YELLOW  YELLOW   APPearance CLEAR  CLEAR   Specific Gravity, Urine 1.020  1.005 - 1.030   pH 6.5  5.0 - 8.0   Glucose, UA NEGATIVE  NEGATIVE mg/dL   Hgb urine dipstick NEGATIVE  NEGATIVE   Bilirubin Urine NEGATIVE  NEGATIVE   Ketones, ur NEGATIVE  NEGATIVE mg/dL   Protein, ur NEGATIVE  NEGATIVE mg/dL   Urobilinogen, UA 2.0 (*) 0.0 - 1.0 mg/dL   Nitrite NEGATIVE  NEGATIVE   Leukocytes, UA NEGATIVE  NEGATIVE    Imaging Studies:    US no previa or hemorrhage  Medications:  Scheduled . docusate sodium  100 mg Oral Daily  . prenatal multivitamin  1 tablet Oral Q1200   I have reviewed the patient's current medications.  ASSESSMENT: Patient Active Problem List    Diagnosis Date Noted  . Vaginal bleeding in pregnancy 04/03/2014  . Supervision of high risk elderly multigravida in second trimester 01/31/2014  . Previous stillbirth or demise, antepartum 01/31/2014    PLAN: Observe for bleeding  [name] 04/04/2014,7:58 AM
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
## 2014-04-05 NOTE — Progress Notes (Signed)
UR completed 

## 2014-04-05 NOTE — Progress Notes (Signed)
Patient ID: [name], female   DOB: 02/10/1981, 33 y.o.   MRN: [id number] ACULTY PRACTICE ANTEPARTUM COMPREHENSIVE PROGRESS NOTE  [name] is a 33 y.o. G3P1101 at 3612w0d  who is admitted for vaginal bleeding in the third trimester.    Length of Stay:  2  Days  Subjective: Pt reports no bleeding or pain since Sunday (2 days ago).  She reports that she wants to go home. Patient reports good fetal movement.  She reports no uterine contractions and no loss of fluid per vagina.  Vitals:  Blood pressure 94/60, pulse 84, temperature 97.8 F (36.6 C), temperature source Oral, resp. rate 18, height 5\' 2"  (1.575 m), weight 138 lb (62.596 kg), last menstrual period 08/03/2013. Physical Examination: General appearance - alert, well appearing, and in no distress Chest - clear to auscultation, no wheezes, rales or rhonchi, symmetric air entry Heart - normal rate, regular rhythm, normal S1, S2, no murmurs, rubs, clicks or gallops Abdomen - soft, nontender, nondistended, no masses or organomegaly gravid Extremities - no pedal edema noted Cervical Exam: Not evaluated. Membranes:intact  Fetal Monitoring:  Baseline: 140's bpm, Variability: Good {> 6 bpm) and Accelerations: Reactive  Labs:  No results found for this or any previous visit (from the past 24 hour(s)).  Imaging Studies:    n/a   Medications:  Scheduled . docusate sodium  100 mg Oral Daily  . prenatal multivitamin  1 tablet Oral Q1200  . sodium chloride  3 mL Intravenous Q12H   I have reviewed the patient's current medications.  ASSESSMENT: Patient Active Problem List   Diagnosis Date Noted  . Vaginal bleeding in pregnancy 04/03/2014  . Supervision of high risk elderly multigravida in second trimester 01/31/2014  . Previous stillbirth or demise, antepartum 01/31/2014    PLAN: Rec inpt observation for 7 days post bleed Social work consult Continue routine antenatal care.   HARRAWAY-SMITH, Elvyn Krohn 04/05/2014,8:47  AM

## 2014-04-06 DIAGNOSIS — O09299 Supervision of pregnancy with other poor reproductive or obstetric history, unspecified trimester: Secondary | ICD-10-CM

## 2014-04-06 DIAGNOSIS — O09529 Supervision of elderly multigravida, unspecified trimester: Secondary | ICD-10-CM

## 2014-04-06 DIAGNOSIS — O4100X0 Oligohydramnios, unspecified trimester, not applicable or unspecified: Secondary | ICD-10-CM

## 2014-04-06 DIAGNOSIS — O4690 Antepartum hemorrhage, unspecified, unspecified trimester: Principal | ICD-10-CM

## 2014-04-06 LAB — TYPE AND SCREEN
ABO/RH(D): O POS
Antibody Screen: NEGATIVE
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
## 2014-04-06 NOTE — Progress Notes (Signed)
Patient ID: [name], female   DOB: 11/26/1980, 33 y.o.   MRN: [id number] [name] is a 33 y.o. G3P1101 at 5014w1d who is admitted for vaginal bleeding in the third trimester.  Length of Stay: 3 Days  Subjective:  Pt reports no bleeding or pain since Sunday (2 days ago). She reports that she wants to go home.  Patient reports good fetal movement. She reports no uterine contractions and no loss of fluid per vagina.  Vitals: Blood pressure 94/60, pulse 84, temperature 97.8 F (36.6 C), temperature source Oral, resp. rate 18, height 5\' 2"  (1.575 m), weight 138 lb (62.596 kg), last menstrual period 08/03/2013.  Physical Examination:  General appearance - alert, well appearing, and in no distress  Chest - clear to auscultation, no wheezes, rales or rhonchi, symmetric air entry  Heart - normal rate, regular rhythm, normal S1, S2, no murmurs, rubs, clicks or gallops  Abdomen - soft, nontender, nondistended, no masses or organomegaly  gravid  Extremities - no pedal edema noted  Cervical Exam: Not evaluated.  Membranes:intact  Fetal Monitoring: Baseline: 140's bpm, Variability: Good {> 6 bpm) and Accelerations: Reactive  Labs:  No results found for this or any previous visit (from the past 24 hour(s)).  Imaging Studies:  n/a  Medications: Scheduled  .  docusate sodium  100 mg  Oral  Daily   .  prenatal multivitamin  1 tablet  Oral  Q1200   .  sodium chloride  3 mL  Intravenous  Q12H   I have reviewed the patient's current medications.  ASSESSMENT:  Patient Active Problem List    Diagnosis  Date Noted   .  Vaginal bleeding in pregnancy  04/03/2014   .  Supervision of high risk elderly multigravida in second trimester  01/31/2014   .  Previous stillbirth or demise, antepartum  01/31/2014   PLAN:  Rec inpt observation for 7 days post bleed  I have asked the nurse to call the clinic and get a follow up appt scheduled for next week  Continue routine antenatal care.

## 2014-04-07 DIAGNOSIS — O4690 Antepartum hemorrhage, unspecified, unspecified trimester: Secondary | ICD-10-CM | POA: Diagnosis present

## 2014-04-07 DIAGNOSIS — O09299 Supervision of pregnancy with other poor reproductive or obstetric history, unspecified trimester: Secondary | ICD-10-CM | POA: Diagnosis not present

## 2014-04-07 DIAGNOSIS — O4100X0 Oligohydramnios, unspecified trimester, not applicable or unspecified: Secondary | ICD-10-CM | POA: Diagnosis present

## 2014-04-07 DIAGNOSIS — O09529 Supervision of elderly multigravida, unspecified trimester: Secondary | ICD-10-CM | POA: Diagnosis present

## 2014-04-07 NOTE — Progress Notes (Signed)
FACULTY PRACTICE ANTEPARTUM(COMPREHENSIVE) NOTE  [name] is a 33 y.o. G3P1101 at 763w2d who is admitted for vaginal bleeding.   Fetal presentation is cephalic. Length of Stay:  4  Days  Subjective: Patient reports the fetal movement as active. Patient reports uterine contraction  activity as none. Patient reports  vaginal bleeding as none. Patient describes fluid per vagina as None.  Vitals:  Blood pressure 99/60, pulse 79, temperature 98 F (36.7 C), temperature source Oral, resp. rate 18, height 5\' 2"  (1.575 m), weight 60.873 kg (134 lb 3.2 oz), last menstrual period 08/03/2013. Physical Examination:  General appearance - alert, well appearing, and in no distress and oriented to person, place, and time Heart - normal rate and regular rhythm Abdomen - soft, nontender, nondistended Fundal Height:  size equals dates Cervical Exam: Not evaluated. and found to be not evaluated/  Extremities: extremities normal, atraumatic, no cyanosis or edema and Homans sign is negative, no sign of DVT with DTRs 2+ bilaterally Membranes:intact  Fetal Monitoring:  Baseline: 140 bpm, Variability: Good {> 6 bpm), Accelerations: Reactive and Decelerations: Absent  Labs:  Results for orders placed during the hospital encounter of 04/03/14 (from the past 24 hour(s))  TYPE AND SCREEN   Collection Time    04/06/14  2:28 PM      Result Value Ref Range   ABO/RH(D) O POS     Antibody Screen NEG     Sample Expiration 04/09/2014      Imaging Studies:    NA  Medications:  Scheduled . docusate sodium  100 mg Oral Daily  . prenatal multivitamin  1 tablet Oral Q1200  . sodium chloride  3 mL Intravenous Q12H   I have reviewed the patient's current medications.  ASSESSMENT: Patient Active Problem List   Diagnosis Date Noted  . Vaginal bleeding in pregnancy 04/03/2014  . Supervision of high risk elderly multigravida in second trimester 01/31/2014  . Previous stillbirth or demise, antepartum  01/31/2014    PLAN: Continue current POC.  D/C home after 1 week of no bleeding.   Joyceann Kruser 04/07/2014,1:23 PM

## 2014-04-07 NOTE — Progress Notes (Signed)
UR chart review completed.  

## 2014-04-08 NOTE — Progress Notes (Signed)
Patient ID: [name], female   DOB: 09/12/1981, 33 y.o.   MRN: [id number] FACULTY PRACTICE ANTEPARTUM(COMPREHENSIVE) NOTE  [name] is a 33 y.o. G3P1101 at 2468w3d  who is admitted for vaginal bleeding in the third trimester.   Length of Stay:  5  Days  Subjective: Patient reports the fetal movement as active. Patient reports uterine contraction  activity as none. Patient reports  vaginal bleeding as none. Patient describes fluid per vagina as None.  Vitals:  Blood pressure 97/64, pulse 77, temperature 98.1 F (36.7 C), temperature source Oral, resp. rate 20, height 5\' 2"  (1.575 m), weight 134 lb 3.2 oz (60.873 kg), last menstrual period 08/03/2013. Physical Examination:  General appearance - alert, well appearing, and in no distress Abdomen - gravid, soft non tender Extremities - peripheral pulses normal, no pedal edema, no clubbing or cyanosis, no edema, redness or tenderness in the calves or thighs, Homan's sign negative bilaterally  Fetal Monitoring:  Baseline: 130 bpm, Variability: Good {> 6 bpm), Accelerations: Reactive and Decelerations: Absent  Labs:  No results found for this or any previous visit (from the past 24 hour(s)).   Medications:  Scheduled . docusate sodium  100 mg Oral Daily  . prenatal multivitamin  1 tablet Oral Q1200   I have reviewed the patient's current medications.  ASSESSMENT: Patient Active Problem List   Diagnosis Date Noted  . Vaginal bleeding in pregnancy 04/03/2014  . Supervision of high risk elderly multigravida in second trimester 01/31/2014  . Previous stillbirth or demise, antepartum 01/31/2014    PLAN: [name] is a 33 y.o. G3P1101 at 5568w3d  who is admitted for vaginal bleeding in the third trimester.    Continue to monitor for 7 days duration.  Pt encouraged to ambulate more today.  Continue NSTs and monitor bleeding.    Kruz Chiu H. 04/08/2014,7:04 AM

## 2014-04-09 LAB — TYPE AND SCREEN
ABO/RH(D): O POS
ANTIBODY SCREEN: NEGATIVE

## 2014-04-09 NOTE — Discharge Summary (Signed)
Antenatal Physician Discharge Summary  Patient ID: [name] MRN: [id number] DOB/AGE: 35/01/1981 35 y.o.  Admit date: 04/03/2014 Discharge date: 04/10/2014  Active Diagnoses: Third trimester bleeding, history of stillbirth  Prenatal Procedures: NST, ultrasound  Consults: None  Significant Diagnostic Studies:  Results for orders placed during the hospital encounter of 04/03/14 (from the past 168 hour(s))  URINALYSIS, ROUTINE W REFLEX MICROSCOPIC   Collection Time    04/03/14  3:45 PM      Result Value Ref Range   Color, Urine YELLOW  YELLOW   APPearance CLEAR  CLEAR   Specific Gravity, Urine 1.015  1.005 - 1.030   pH 6.0  5.0 - 8.0   Glucose, UA NEGATIVE  NEGATIVE mg/dL   Hgb urine dipstick LARGE (*) NEGATIVE   Bilirubin Urine NEGATIVE  NEGATIVE   Ketones, ur NEGATIVE  NEGATIVE mg/dL   Protein, ur NEGATIVE  NEGATIVE mg/dL   Urobilinogen, UA 1.0  0.0 - 1.0 mg/dL   Nitrite NEGATIVE  NEGATIVE   Leukocytes, UA NEGATIVE  NEGATIVE  URINE RAPID DRUG SCREEN (HOSP PERFORMED)   Collection Time    04/03/14  3:45 PM      Result Value Ref Range   Opiates NONE DETECTED  NONE DETECTED   Cocaine NONE DETECTED  NONE DETECTED   Benzodiazepines NONE DETECTED  NONE DETECTED   Amphetamines NONE DETECTED  NONE DETECTED   Tetrahydrocannabinol NONE DETECTED  NONE DETECTED   Barbiturates NONE DETECTED  NONE DETECTED  URINE MICROSCOPIC-ADD ON   Collection Time    04/03/14  3:45 PM      Result Value Ref Range   Squamous Epithelial / LPF FEW (*) RARE   WBC, UA 0-2  <3 WBC/hpf   RBC / HPF 3-6  <3 RBC/hpf   Bacteria, UA RARE  RARE  CBC   Collection Time    04/03/14  4:55 PM      Result Value Ref Range   WBC 7.1  4.0 - 10.5 K/uL   RBC 3.61 (*) 3.87 - 5.11 MIL/uL   Hemoglobin 11.3 (*) 12.0 - 15.0 g/dL   HCT 11.932.4 (*) 14.736.0 - 82.946.0 %   MCV 89.8  78.0 - 100.0 fL   MCH 31.3  26.0 - 34.0 pg   MCHC 34.9  30.0 - 36.0 g/dL   RDW 56.212.8  13.011.5 - 86.515.5 %   Platelets 234  150 - 400 K/uL  TYPE AND  SCREEN   Collection Time    04/03/14  4:55 PM      Result Value Ref Range   ABO/RH(D) O POS     Antibody Screen NEG     Sample Expiration 04/06/2014    ABO/RH   Collection Time    04/03/14  4:55 PM      Result Value Ref Range   ABO/RH(D) O POS    URINALYSIS, ROUTINE W REFLEX MICROSCOPIC   Collection Time    04/03/14  6:53 PM      Result Value Ref Range   Color, Urine YELLOW  YELLOW   APPearance CLEAR  CLEAR   Specific Gravity, Urine 1.020  1.005 - 1.030   pH 6.5  5.0 - 8.0   Glucose, UA NEGATIVE  NEGATIVE mg/dL   Hgb urine dipstick NEGATIVE  NEGATIVE   Bilirubin Urine NEGATIVE  NEGATIVE   Ketones, ur NEGATIVE  NEGATIVE mg/dL   Protein, ur NEGATIVE  NEGATIVE mg/dL   Urobilinogen, UA 2.0 (*) 0.0 - 1.0 mg/dL   Nitrite NEGATIVE  NEGATIVE   Leukocytes, UA NEGATIVE  NEGATIVE  TYPE AND SCREEN   Collection Time    04/06/14  2:28 PM      Result Value Ref Range   ABO/RH(D) O POS     Antibody Screen NEG     Sample Expiration 04/09/2014    TYPE AND SCREEN   Collection Time    04/09/14  4:09 PM      Result Value Ref Range   ABO/RH(D) O POS     Antibody Screen NEG     Sample Expiration 04/12/2014      Treatments: IV hydration, observation  Hospital Course:  This is a 33 y.o. G3P1101 with IUP at [redacted]w[redacted]d admitted for third trimester bleeding in the setting of history of stillbirth at 72 weeks. Only had bleeding prior to admission, no further bleeding noted during the week of observation.  The fetal heart rate monitoring remained reassuring with daily reactive NSTs, and she had no signs/symptoms of progressing preterm labor or other maternal-fetal concerns.  She was deemed stable for discharge to home with outpatient follow up.  Discharge Exam: BP 103/64  Pulse 82  Temp(Src) 98 F (36.7 C) (Oral)  Resp 16  Ht 5\' 2"  (1.575 m)  Wt 134 lb 3.2 oz (60.873 kg)  BMI 24.54 kg/m2  LMP 08/03/2013 General appearance: alert and no distress GI: soft, non-tender; bowel sounds normal; no  masses,  no organomegaly Pelvic: deferred Extremities: extremities normal, atraumatic, no cyanosis or edema and Homans sign is negative, no sign of DVT  Discharge Condition: Stable  Disposition: Home     Medication List         prenatal multivitamin Tabs tablet  Take 1 tablet by mouth daily at 12 noon.       Follow-up Information   Follow up with [facility] On 04/12/2014. (For antenatal visit)    Contact information:   [address] [place] [address] [phone]      Signed: Tereso Newcomer M.D. 04/10/2014, 7:33 AM

## 2014-04-09 NOTE — Progress Notes (Signed)
FACULTY PRACTICE ANTEPARTUM(COMPREHENSIVE) NOTE  [name] is a 33 y.o. G3P1101 at 2411w4d who is admitted for bleeding in the third trimester.   Fetal presentation is cephalic. Length of Stay:  6  Days  Subjective: The pt has had no bleeding since admit Patient reports the fetal movement as active. Patient reports uterine contraction  activity as none. Patient reports  vaginal bleeding as none. Patient describes fluid per vagina as None.  Vitals:  Blood pressure 103/73, pulse 81, temperature 97.8 F (36.6 C), temperature source Oral, resp. rate 16, height 5\' 2"  (1.575 m), weight 60.873 kg (134 lb 3.2 oz), last menstrual period 08/03/2013. Physical Examination:  General appearance - alert, well appearing, and in no distress Heart - normal rate and regular rhythm Abdomen - soft, nontender, nondistended Fundal Height:  size equals dates Cervical Exam: Not evaluated.  Extremities: extremities normal, atraumatic, no cyanosis or edema and Homans sign is negative, no sign of DVT with DTRs 2+ bilaterally Membranes:intact  Fetal Monitoring:  Baseline: 140 bpm accels yes, decels no,  Labs:  No results found for this or any previous visit (from the past 24 hour(s)).  Imaging Studies:      Medications:  Scheduled . docusate sodium  100 mg Oral Daily  . prenatal multivitamin  1 tablet Oral Q1200   I have reviewed the patient's current medications.  ASSESSMENT: Patient Active Problem List   Diagnosis Date Noted  . Vaginal bleeding in pregnancy 04/03/2014  . Supervision of high risk elderly multigravida in second trimester 01/31/2014  . Previous stillbirth or demise, antepartum 01/31/2014    PLAN: Continued observation today,  Discharge Sunday for home care  Loriel Diehl V 04/09/2014,7:46 AM

## 2014-04-10 NOTE — Discharge Instructions (Signed)
°  Vaginal Bleeding During Pregnancy, Third Trimester °A small amount of bleeding (spotting) from the vagina is relatively common in pregnancy. Various things can cause bleeding or spotting in pregnancy. Sometimes the bleeding is normal and is not a problem. However, bleeding during the third trimester can also be a sign of something serious for the mother and the baby. Be sure to tell your health care provider about any vaginal bleeding right away.  °Some possible causes of vaginal bleeding during the third trimester include:  °· The placenta may be partially or completely covering the opening to the cervix (placenta previa).   °· The placenta may have separated from the uterus (abruption of the placenta).   °· There may be an infection or growth on the cervix.   °· You may be starting labor, called discharging of the mucus plug.   °· The placenta may grow into the muscle layer of the uterus (placenta accreta).   °HOME CARE INSTRUCTIONS  °Watch your condition for any changes. The following actions may help to lessen any discomfort you are feeling:  °· Follow your health care provider's instructions for limiting your activity. If your health care provider orders bed rest, you may need to stay in bed and only get up to use the bathroom. However, your health care provider may allow you to continue light activity. °· If needed, make plans for someone to help with your regular activities and responsibilities while you are on bed rest. °· Keep track of the number of pads you use each day, how often you change pads, and how soaked (saturated) they are. Write this down. °· Do not use tampons. Do not douche. °· Do not have sexual intercourse or orgasms until approved by your health care provider. °· Follow your health care provider's advice about lifting, driving, and physical activities. °· If you pass any tissue from your vagina, save the tissue so you can show it to your health care provider.   °· Only take  over-the-counter or prescription medicines as directed by your health care provider. °· Do not take aspirin because it can make you bleed.   °· Keep all follow-up appointments as directed by your health care provider. °SEEK MEDICAL CARE IF: °· You have any vaginal bleeding during any part of your pregnancy. °· You have cramps or labor pains. °· You have a fever, not controlled by medicine. °SEEK IMMEDIATE MEDICAL CARE IF:  °· You have severe cramps or pain in your back or belly (abdomen). °· You have chills. °· You have a gush of fluid from the vagina. °· You pass large clots or tissue from your vagina. °· Your bleeding increases. °· You feel light-headed or weak. °· You pass out. °· You feel less movement or no movement of the baby.   °MAKE SURE YOU: °· Understand these instructions. °· Will watch your condition. °· Will get help right away if you are not doing well or get worse. °Document Released: 11/23/2002 Document Revised: 09/07/2013 Document Reviewed: 05/10/2013 °ExitCare® Patient Information ©2015 [employer]. This information is not intended to replace advice given to you by your health care provider. Make sure you discuss any questions you have with your health care provider. ° ° °

## 2014-04-12 ENCOUNTER — Ambulatory Visit (INDEPENDENT_AMBULATORY_CARE_PROVIDER_SITE_OTHER): Payer: Self-pay | Admitting: Obstetrics & Gynecology

## 2014-04-12 ENCOUNTER — Encounter: Payer: Self-pay | Admitting: Obstetrics & Gynecology

## 2014-04-12 VITALS — BP 102/60 | HR 74 | Temp 97.5°F | Wt 136.4 lb

## 2014-04-12 DIAGNOSIS — O09522 Supervision of elderly multigravida, second trimester: Secondary | ICD-10-CM

## 2014-04-12 DIAGNOSIS — O09299 Supervision of pregnancy with other poor reproductive or obstetric history, unspecified trimester: Secondary | ICD-10-CM

## 2014-04-12 DIAGNOSIS — O09529 Supervision of elderly multigravida, unspecified trimester: Secondary | ICD-10-CM

## 2014-04-12 LAB — POCT URINALYSIS DIP (DEVICE)
Bilirubin Urine: NEGATIVE
Glucose, UA: NEGATIVE mg/dL
Hgb urine dipstick: NEGATIVE
Ketones, ur: NEGATIVE mg/dL
LEUKOCYTES UA: NEGATIVE
NITRITE: NEGATIVE
PROTEIN: NEGATIVE mg/dL
Specific Gravity, Urine: 1.015 (ref 1.005–1.030)
UROBILINOGEN UA: 0.2 mg/dL (ref 0.0–1.0)
pH: 8.5 — ABNORMAL HIGH (ref 5.0–8.0)

## 2014-04-12 LAB — OB RESULTS CONSOLE GBS: GBS: NEGATIVE

## 2014-04-12 NOTE — Progress Notes (Signed)
Reports intermittent pelvic pressure but denies any pain.

## 2014-04-12 NOTE — Progress Notes (Signed)
Routine visit. Good FM. No problems. Labor precautions reviewed. Cervical cultures obtained. 

## 2014-04-13 LAB — GC/CHLAMYDIA PROBE AMP
CT Probe RNA: NEGATIVE
GC PROBE AMP APTIMA: NEGATIVE

## 2014-04-13 LAB — OB RESULTS CONSOLE GC/CHLAMYDIA
Chlamydia: NEGATIVE
Gonorrhea: NEGATIVE

## 2014-04-14 LAB — CULTURE, BETA STREP (GROUP B ONLY)

## 2014-04-20 ENCOUNTER — Ambulatory Visit (INDEPENDENT_AMBULATORY_CARE_PROVIDER_SITE_OTHER): Payer: Self-pay | Admitting: Advanced Practice Midwife

## 2014-04-20 VITALS — BP 108/50 | HR 97 | Wt 138.2 lb

## 2014-04-20 DIAGNOSIS — O09299 Supervision of pregnancy with other poor reproductive or obstetric history, unspecified trimester: Secondary | ICD-10-CM

## 2014-04-20 DIAGNOSIS — O09293 Supervision of pregnancy with other poor reproductive or obstetric history, third trimester: Secondary | ICD-10-CM

## 2014-04-20 LAB — POCT URINALYSIS DIP (DEVICE)
Bilirubin Urine: NEGATIVE
Glucose, UA: NEGATIVE mg/dL
Hgb urine dipstick: NEGATIVE
Leukocytes, UA: NEGATIVE
NITRITE: NEGATIVE
PROTEIN: NEGATIVE mg/dL
Specific Gravity, Urine: 1.02 (ref 1.005–1.030)
Urobilinogen, UA: 1 mg/dL (ref 0.0–1.0)
pH: 7 (ref 5.0–8.0)

## 2014-04-20 LAB — US OB FOLLOW UP

## 2014-04-20 NOTE — Progress Notes (Signed)
Doing well.  Good fetal movement, denies vaginal bleeding, LOF, regular contractions. Started twice weekly testing for hx IUFD today.  NST-R, AFI normal.  Reviewed signs of labor/warning signs.

## 2014-04-20 NOTE — Progress Notes (Signed)
Fetal testing protocol implemented.  IOL scheduled per protocol @ 39 wks on 05/03/14 @ 0730.

## 2014-04-22 ENCOUNTER — Other Ambulatory Visit: Payer: Self-pay

## 2014-04-26 ENCOUNTER — Ambulatory Visit (INDEPENDENT_AMBULATORY_CARE_PROVIDER_SITE_OTHER): Payer: Self-pay | Admitting: Advanced Practice Midwife

## 2014-04-26 VITALS — BP 97/64 | HR 79 | Wt 139.0 lb

## 2014-04-26 DIAGNOSIS — O09293 Supervision of pregnancy with other poor reproductive or obstetric history, third trimester: Secondary | ICD-10-CM

## 2014-04-26 DIAGNOSIS — O09529 Supervision of elderly multigravida, unspecified trimester: Secondary | ICD-10-CM

## 2014-04-26 DIAGNOSIS — O09299 Supervision of pregnancy with other poor reproductive or obstetric history, unspecified trimester: Secondary | ICD-10-CM

## 2014-04-26 DIAGNOSIS — O09522 Supervision of elderly multigravida, second trimester: Secondary | ICD-10-CM

## 2014-04-26 LAB — FETAL NONSTRESS TEST

## 2014-04-26 LAB — POCT URINALYSIS DIP (DEVICE)
Bilirubin Urine: NEGATIVE
GLUCOSE, UA: NEGATIVE mg/dL
Hgb urine dipstick: NEGATIVE
Ketones, ur: NEGATIVE mg/dL
Leukocytes, UA: NEGATIVE
NITRITE: NEGATIVE
Protein, ur: NEGATIVE mg/dL
Specific Gravity, Urine: 1.015 (ref 1.005–1.030)
Urobilinogen, UA: 1 mg/dL (ref 0.0–1.0)
pH: 7 (ref 5.0–8.0)

## 2014-04-26 NOTE — Progress Notes (Signed)
IOL scheduled on 8/18 per protocol.

## 2014-04-26 NOTE — Patient Instructions (Signed)
Labor Induction  Labor induction is when steps are taken to cause a pregnant woman to begin the labor process. Most women go into labor on their own between 37 weeks and 42 weeks of the pregnancy. When this does not happen or when there is a medical need, methods may be used to induce labor. Labor induction causes a pregnant woman's uterus to contract. It also causes the cervix to soften (ripen), open (dilate), and thin out (efface). Usually, labor is not induced before 39 weeks of the pregnancy unless there is a problem with the baby or mother.  Before inducing labor, your health care provider will consider a number of factors, including the following:  The medical condition of you and the baby.   How many weeks along you are.   The status of the baby's lung maturity.   The condition of the cervix.   The position of the baby.  WHAT ARE THE REASONS FOR LABOR INDUCTION? Labor may be induced for the following reasons:  The health of the baby or mother is at risk.   The pregnancy is overdue by 1 week or more.   The water breaks but labor does not start on its own.   The mother has a health condition or serious illness, such as high blood pressure, infection, placental abruption, or diabetes.  The amniotic fluid amounts are low around the baby.   The baby is distressed.  Convenience or wanting the baby to be born on a certain date is not a reason for inducing labor. WHAT METHODS ARE USED FOR LABOR INDUCTION? Several methods of labor induction may be used, such as:   Prostaglandin medicine. This medicine causes the cervix to dilate and ripen. The medicine will also start contractions. It can be taken by mouth or by inserting a suppository into the vagina.   Inserting a thin tube (catheter) with a balloon on the end into the vagina to dilate the cervix. Once inserted, the balloon is expanded with water, which causes the cervix to open.   Stripping the membranes. Your health  care provider separates amniotic sac tissue from the cervix, causing the cervix to be stretched and causing the release of a hormone called progesterone. This may cause the uterus to contract. It is often done during an office visit. You will be sent home to wait for the contractions to begin. You will then come in for an induction.   Breaking the water. Your health care provider makes a hole in the amniotic sac using a small instrument. Once the amniotic sac breaks, contractions should begin. This may still take hours to see an effect.   Medicine to trigger or strengthen contractions. This medicine is given through an IV access tube inserted into a vein in your arm.  All of the methods of induction, besides stripping the membranes, will be done in the hospital. Induction is done in the hospital so that you and the baby can be carefully monitored.  HOW LONG DOES IT TAKE FOR LABOR TO BE INDUCED? Some inductions can take up to 2-3 days. Depending on the cervix, it usually takes less time. It takes longer when you are induced early in the pregnancy or if this is your first pregnancy. If a mother is still pregnant and the induction has been going on for 2-3 days, either the mother will be sent home or a cesarean delivery will be needed. WHAT ARE THE RISKS ASSOCIATED WITH LABOR INDUCTION? Some of the risks of induction   include:   Changes in fetal heart rate, such as too high, too low, or erratic.   Fetal distress.   Chance of infection for the mother and baby.   Increased chance of having a cesarean delivery.   Breaking off (abruption) of the placenta from the uterus (rare).   Uterine rupture (very rare).  When induction is needed for medical reasons, the benefits of induction may outweigh the risks. WHAT ARE SOME REASONS FOR NOT INDUCING LABOR? Labor induction should not be done if:   It is shown that your baby does not tolerate labor.   You have had previous surgeries on your  uterus, such as a myomectomy or the removal of fibroids.   Your placenta lies very low in the uterus and blocks the opening of the cervix (placenta previa).   Your baby is not in a head-down position.   The umbilical cord drops down into the birth canal in front of the baby. This could cut off the baby's blood and oxygen supply.   You have had a previous cesarean delivery.   There are unusual circumstances, such as the baby being extremely premature.  Document Released: 01/22/2007 Document Revised: 05/05/2013 Document Reviewed: 04/01/2013 [facility] Patient Information 2015 [place], [place]. This information is not intended to replace advice given to you by your health care provider. Make sure you discuss any questions you have with your health care provider.   Fetal Movement Counts Patient Name: __________________________________________________ Patient Due Date: ____________________ Performing a fetal movement count is highly recommended in high-risk pregnancies, but it is good for every pregnant woman to do. Your health care provider may ask you to start counting fetal movements at 28 weeks of the pregnancy. Fetal movements often increase:  After eating a full meal.  After physical activity.  After eating or drinking something sweet or cold.  At rest. Pay attention to when you feel the baby is most active. This will help you notice a pattern of your baby's sleep and wake cycles and what factors contribute to an increase in fetal movement. It is important to perform a fetal movement count at the same time each day when your baby is normally most active.  HOW TO COUNT FETAL MOVEMENTS 1. Find a quiet and comfortable area to sit or lie down on your left side. Lying on your left side provides the best blood and oxygen circulation to your baby. 2. Write down the day and time on a sheet of paper or in a journal. 3. Start counting kicks, flutters, swishes, rolls, or jabs in a 2-hour  period. You should feel at least 10 movements within 2 hours. 4. If you do not feel 10 movements in 2 hours, wait 2-3 hours and count again. Look for a change in the pattern or not enough counts in 2 hours. SEEK MEDICAL CARE IF:  You feel less than 10 counts in 2 hours, tried twice.  There is no movement in over an hour.  The pattern is changing or taking longer each day to reach 10 counts in 2 hours.  You feel the baby is not moving as he or she usually does. Date: ____________ Movements: ____________ Start time: ____________ Doreatha Martin time: ____________  Date: ____________ Movements: ____________ Start time: ____________ Doreatha Martin time: ____________ Date: ____________ Movements: ____________ Start time: ____________ Doreatha Martin time: ____________ Date: ____________ Movements: ____________ Start time: ____________ Doreatha Martin time: ____________ Date: ____________ Movements: ____________ Start time: ____________ Doreatha Martin time: ____________ Date: ____________ Movements: ____________ Start time: ____________ Doreatha Martin time:  ____________ Date: ____________ Movements: ____________ Start time: ____________ Doreatha MartinFinish time: ____________ Date: ____________ Movements: ____________ Start time: ____________ Doreatha MartinFinish time: ____________  Date: ____________ Movements: ____________ Start time: ____________ Doreatha MartinFinish time: ____________ Date: ____________ Movements: ____________ Start time: ____________ Doreatha MartinFinish time: ____________ Date: ____________ Movements: ____________ Start time: ____________ Doreatha MartinFinish time: ____________ Date: ____________ Movements: ____________ Start time: ____________ Doreatha MartinFinish time: ____________ Date: ____________ Movements: ____________ Start time: ____________ Doreatha MartinFinish time: ____________ Date: ____________ Movements: ____________ Start time: ____________ Doreatha MartinFinish time: ____________ Date: ____________ Movements: ____________ Start time: ____________ Doreatha MartinFinish time: ____________  Date: ____________ Movements: ____________  Start time: ____________ Doreatha MartinFinish time: ____________ Date: ____________ Movements: ____________ Start time: ____________ Doreatha MartinFinish time: ____________ Date: ____________ Movements: ____________ Start time: ____________ Doreatha MartinFinish time: ____________ Date: ____________ Movements: ____________ Start time: ____________ Doreatha MartinFinish time: ____________ Date: ____________ Movements: ____________ Start time: ____________ Doreatha MartinFinish time: ____________ Date: ____________ Movements: ____________ Start time: ____________ Doreatha MartinFinish time: ____________ Date: ____________ Movements: ____________ Start time: ____________ Doreatha MartinFinish time: ____________  Date: ____________ Movements: ____________ Start time: ____________ Doreatha MartinFinish time: ____________ Date: ____________ Movements: ____________ Start time: ____________ Doreatha MartinFinish time: ____________ Date: ____________ Movements: ____________ Start time: ____________ Doreatha MartinFinish time: ____________ Date: ____________ Movements: ____________ Start time: ____________ Doreatha MartinFinish time: ____________ Date: ____________ Movements: ____________ Start time: ____________ Doreatha MartinFinish time: ____________ Date: ____________ Movements: ____________ Start time: ____________ Doreatha MartinFinish time: ____________ Date: ____________ Movements: ____________ Start time: ____________ Doreatha MartinFinish time: ____________  Date: ____________ Movements: ____________ Start time: ____________ Doreatha MartinFinish time: ____________ Date: ____________ Movements: ____________ Start time: ____________ Doreatha MartinFinish time: ____________ Date: ____________ Movements: ____________ Start time: ____________ Doreatha MartinFinish time: ____________ Date: ____________ Movements: ____________ Start time: ____________ Doreatha MartinFinish time: ____________ Date: ____________ Movements: ____________ Start time: ____________ Doreatha MartinFinish time: ____________ Date: ____________ Movements: ____________ Start time: ____________ Doreatha MartinFinish time: ____________ Date: ____________ Movements: ____________ Start time: ____________ Doreatha MartinFinish time:  ____________  Date: ____________ Movements: ____________ Start time: ____________ Doreatha MartinFinish time: ____________ Date: ____________ Movements: ____________ Start time: ____________ Doreatha MartinFinish time: ____________ Date: ____________ Movements: ____________ Start time: ____________ Doreatha MartinFinish time: ____________ Date: ____________ Movements: ____________ Start time: ____________ Doreatha MartinFinish time: ____________ Date: ____________ Movements: ____________ Start time: ____________ Doreatha MartinFinish time: ____________ Date: ____________ Movements: ____________ Start time: ____________ Doreatha MartinFinish time: ____________ Date: ____________ Movements: ____________ Start time: ____________ Doreatha MartinFinish time: ____________  Date: ____________ Movements: ____________ Start time: ____________ Doreatha MartinFinish time: ____________ Date: ____________ Movements: ____________ Start time: ____________ Doreatha MartinFinish time: ____________ Date: ____________ Movements: ____________ Start time: ____________ Doreatha MartinFinish time: ____________ Date: ____________ Movements: ____________ Start time: ____________ Doreatha MartinFinish time: ____________ Date: ____________ Movements: ____________ Start time: ____________ Doreatha MartinFinish time: ____________ Date: ____________ Movements: ____________ Start time: ____________ Doreatha MartinFinish time: ____________ Date: ____________ Movements: ____________ Start time: ____________ Doreatha MartinFinish time: ____________  Date: ____________ Movements: ____________ Start time: ____________ Doreatha MartinFinish time: ____________ Date: ____________ Movements: ____________ Start time: ____________ Doreatha MartinFinish time: ____________ Date: ____________ Movements: ____________ Start time: ____________ Doreatha MartinFinish time: ____________ Date: ____________ Movements: ____________ Start time: ____________ Doreatha MartinFinish time: ____________ Date: ____________ Movements: ____________ Start time: ____________ Doreatha MartinFinish time: ____________ Date: ____________ Movements: ____________ Start time: ____________ Doreatha MartinFinish time: ____________ Document Released: 10/02/2006  Document Revised: 01/17/2014 Document Reviewed: 06/29/2012 [employer] Patient Information 2015 [employer]. This information is not intended to replace advice given to you by your health care provider. Make sure you discuss any questions you have with your health care provider.

## 2014-04-26 NOTE — Progress Notes (Signed)
Reactive NST. IOL 8/18.

## 2014-04-27 ENCOUNTER — Encounter: Payer: Self-pay | Admitting: Obstetrics & Gynecology

## 2014-04-27 ENCOUNTER — Ambulatory Visit: Payer: Self-pay | Admitting: Nurse Practitioner

## 2014-04-29 ENCOUNTER — Telehealth (HOSPITAL_COMMUNITY): Payer: Self-pay | Admitting: *Deleted

## 2014-04-29 ENCOUNTER — Other Ambulatory Visit: Payer: Self-pay

## 2014-04-29 NOTE — Telephone Encounter (Signed)
Preadmission screen  

## 2014-05-02 ENCOUNTER — Encounter (HOSPITAL_COMMUNITY): Payer: Self-pay | Admitting: *Deleted

## 2014-05-02 ENCOUNTER — Inpatient Hospital Stay (HOSPITAL_COMMUNITY)
Admission: AD | Admit: 2014-05-02 | Discharge: 2014-05-04 | DRG: 775 | Disposition: A | Discharge status: Home / Self Care | Payer: Medicaid Other | Source: Ambulatory Visit | Attending: Family Medicine | Admitting: Family Medicine

## 2014-05-02 DIAGNOSIS — Z825 Family history of asthma and other chronic lower respiratory diseases: Secondary | ICD-10-CM | POA: Diagnosis not present

## 2014-05-02 DIAGNOSIS — O4693 Antepartum hemorrhage, unspecified, third trimester: Secondary | ICD-10-CM

## 2014-05-02 DIAGNOSIS — Z8249 Family history of ischemic heart disease and other diseases of the circulatory system: Secondary | ICD-10-CM | POA: Diagnosis not present

## 2014-05-02 DIAGNOSIS — O4690 Antepartum hemorrhage, unspecified, unspecified trimester: Secondary | ICD-10-CM | POA: Diagnosis present

## 2014-05-02 DIAGNOSIS — O09529 Supervision of elderly multigravida, unspecified trimester: Secondary | ICD-10-CM | POA: Diagnosis present

## 2014-05-02 DIAGNOSIS — O09299 Supervision of pregnancy with other poor reproductive or obstetric history, unspecified trimester: Secondary | ICD-10-CM

## 2014-05-02 LAB — CBC
HEMATOCRIT: 33.6 % — AB (ref 36.0–46.0)
HEMOGLOBIN: 11.6 g/dL — AB (ref 12.0–15.0)
MCH: 31 pg (ref 26.0–34.0)
MCHC: 34.5 g/dL (ref 30.0–36.0)
MCV: 89.8 fL (ref 78.0–100.0)
Platelets: 240 10*3/uL (ref 150–400)
RBC: 3.74 MIL/uL — ABNORMAL LOW (ref 3.87–5.11)
RDW: 13 % (ref 11.5–15.5)
WBC: 6.7 10*3/uL (ref 4.0–10.5)

## 2014-05-02 LAB — TYPE AND SCREEN
ABO/RH(D): O POS
ANTIBODY SCREEN: NEGATIVE

## 2014-05-02 LAB — RPR

## 2014-05-02 MED ORDER — CITRIC ACID-SODIUM CITRATE 334-500 MG/5ML PO SOLN: 30.0000 mL | ORAL | Status: DC | PRN

## 2014-05-02 MED ORDER — FENTANYL CITRATE 0.05 MG/ML IJ SOLN
100.0000 ug | INTRAMUSCULAR | Status: DC | PRN
  Administered 2014-05-02 – 2014-05-03 (×2): 100 ug via INTRAVENOUS
  Filled 2014-05-02 (×2): qty 2

## 2014-05-02 MED ORDER — ACETAMINOPHEN 325 MG PO TABS: 650.0000 mg | ORAL_TABLET | ORAL | Status: DC | PRN

## 2014-05-02 MED ORDER — TERBUTALINE SULFATE 1 MG/ML IJ SOLN: 0.2500 mg | Freq: Once | INTRAMUSCULAR | Status: AC | PRN

## 2014-05-02 MED ORDER — LACTATED RINGERS IV SOLN
INTRAVENOUS | Status: DC
  Administered 2014-05-02 (×2): via INTRAVENOUS

## 2014-05-02 MED ORDER — LIDOCAINE HCL (PF) 1 % IJ SOLN
30.0000 mL | INTRAMUSCULAR | Status: DC | PRN
  Administered 2014-05-03: 30 mL via SUBCUTANEOUS
  Filled 2014-05-02: qty 30

## 2014-05-02 MED ORDER — OXYCODONE-ACETAMINOPHEN 5-325 MG PO TABS: 1.0000 | ORAL_TABLET | ORAL | Status: DC | PRN

## 2014-05-02 MED ORDER — FLEET ENEMA 7-19 GM/118ML RE ENEM: 1.0000 | ENEMA | Freq: Every day | RECTAL | Status: DC | PRN

## 2014-05-02 MED ORDER — IBUPROFEN 600 MG PO TABS: 600.0000 mg | ORAL_TABLET | Freq: Four times a day (QID) | ORAL | Status: DC | PRN

## 2014-05-02 MED ORDER — LACTATED RINGERS IV SOLN: 500.0000 mL | INTRAVENOUS | Status: DC | PRN

## 2014-05-02 MED ORDER — OXYTOCIN 40 UNITS IN LACTATED RINGERS INFUSION - SIMPLE MED
1.0000 m[IU]/min | INTRAVENOUS | Status: DC
  Administered 2014-05-02: 2 m[IU]/min via INTRAVENOUS
  Filled 2014-05-02: qty 1000

## 2014-05-02 MED ORDER — ONDANSETRON HCL 4 MG/2ML IJ SOLN
4.0000 mg | Freq: Four times a day (QID) | INTRAMUSCULAR | Status: DC | PRN
  Administered 2014-05-02: 4 mg via INTRAVENOUS
  Filled 2014-05-02: qty 2

## 2014-05-02 MED ORDER — OXYTOCIN BOLUS FROM INFUSION
500.0000 mL | INTRAVENOUS | Status: DC
  Administered 2014-05-03: 500 mL via INTRAVENOUS

## 2014-05-02 MED ORDER — OXYTOCIN 40 UNITS IN LACTATED RINGERS INFUSION - SIMPLE MED: 62.5000 mL/h | INTRAVENOUS | Status: DC

## 2014-05-02 NOTE — MAU Note (Signed)
Patient reports fetal movement but is not as much as usual.

## 2014-05-02 NOTE — MAU Note (Signed)
Patient states she is having vaginal bleeding and abdominal pain. Panties are saturated with blood, not wearing a pad.

## 2014-05-02 NOTE — H&P (Signed)
LABOR ADMISSION HISTORY AND PHYSICAL  [name] is a 33 y.o. female 253P1101 with IUP at 2984w6d by LMP presenting with vaginal bleeding, scheduled for IOL 2/2 IUFD previously tomorrow. She reports +FMs, No LOF, no VB, no blurry vision, headaches or peripheral edema, and RUQ pain. She declines an epidural for labor pain control. She plans on breast feeding. She request mirena for birth control.  Dating: By LMP --->  Estimated Date of Delivery: 05/10/14  Prenatal History/Complications:  Past Medical History: Past Medical History  Diagnosis Date  . Oligohydramnios   . Preterm labor     Past Surgical History: History reviewed. No pertinent past surgical history.  Obstetrical History: OB History   Grav Para Term Preterm Abortions TAB SAB Ect Mult Living   3 2 1 1  0 0 0 0 0 1      IUFD at 36wks  Social History: History   Social History  . Marital Status: Single    Spouse Name: N/A    Number of Children: N/A  . Years of Education: N/A   Social History Main Topics  . Smoking status: Never Smoker   . Smokeless tobacco: Never Used  . Alcohol Use: No  . Drug Use: No  . Sexual Activity: Yes    Birth Control/ Protection: Rhythm     Comment: last sex several months ago   Other Topics Concern  . None   Social History Narrative  . None    Family History: Family History  Problem Relation Age of Onset  . Cancer Mother   . Alcohol abuse Brother   . Asthma Brother   . Birth defects Brother   . Alcohol abuse Paternal Aunt   . Miscarriages / Stillbirths Maternal Grandmother   . Heart disease Maternal Grandfather     Allergies: No Known Allergies  Prescriptions prior to admission  Medication Sig Dispense Refill  . Multiple Vitamins-Minerals (MULTIVITAMIN PO) Take 1 scoop by mouth daily. Multivitamin Shake      . Prenatal Vit-Fe Fumarate-FA (PRENATAL MULTIVITAMIN) TABS tablet Take 1 tablet by mouth daily at 12 noon.         Review of Systems   All systems  reviewed and negative except as stated in HPI  Blood pressure 105/71, pulse 65, temperature 97.9 F (36.6 C), temperature source Oral, resp. rate 16, height 5\' 2"  (1.575 m), weight 140 lb (63.504 kg), last menstrual period 08/03/2013. General appearance: alert and cooperative Lungs: normal effort, no respiratory distress Heart: regular rate Abdomen: soft, non-tender; bowel sounds normal, gravid Pelvic: 2/ Extremities: Homans sign is negative, no sign of DVT DTR's not examined Presentation: cephalic Fetal monitoringBaseline: 145 bpm, no decels, +accels Uterine activityNone     Prenatal labs: ABO, Rh: --/--/O POS (08/17 1650) Antibody: NEG (08/17 1650) Rubella:   RPR: NON REAC (05/18 1216)  HBsAg: NEGATIVE (05/18 1216)  HIV: NONREACTIVE (05/18 1216)  GBS: Negative (07/28 0000)  1 hr Glucola 117 Genetic screening  Too late Anatomy US normal   Prenatal Transfer Tool  Maternal Diabetes: No Genetic Screening: Declined Maternal Ultrasounds/Referrals: Normal Fetal Ultrasounds or other Referrals:  None Maternal Substance Abuse:  No Significant Maternal Medications:  None Significant Maternal Lab Results: None     Results for orders placed during the hospital encounter of 05/02/14 (from the past 24 hour(s))  CBC   Collection Time    05/02/14  4:50 PM      Result Value Ref Range   WBC 6.7  4.0 - 10.5 K/uL  RBC 3.74 (*) 3.87 - 5.11 MIL/uL   Hemoglobin 11.6 (*) 12.0 - 15.0 g/dL   HCT 56.2 (*) 13.0 - 86.5 %   MCV 89.8  78.0 - 100.0 fL   MCH 31.0  26.0 - 34.0 pg   MCHC 34.5  30.0 - 36.0 g/dL   RDW 78.4  69.6 - 29.5 %   Platelets 240  150 - 400 K/uL  TYPE AND SCREEN   Collection Time    05/02/14  4:50 PM      Result Value Ref Range   ABO/RH(D) O POS     Antibody Screen NEG     Sample Expiration 05/05/2014      Patient Active Problem List   Diagnosis Date Noted  . Vaginal bleeding in pregnancy 04/03/2014  . Supervision of high risk elderly multigravida in second  trimester 01/31/2014  . Previous stillbirth or demise, antepartum 01/31/2014    Assessment: [name] is a 33 y.o. G3P1101 at [redacted]w[redacted]d here for IOL 2/2 hx of IUFD and for vaginal bleeding concerning for possible abruption  #Labor: pitocin, would like to eat dinner first #Pain: Declines epidural, prn IV meds #FWB: catI #ID:  GBS neg #MOF: breast #MOC:mirena #Circ:  declines  [name] [name] 05/02/2014, 7:40 PM

## 2014-05-03 ENCOUNTER — Encounter (HOSPITAL_COMMUNITY): Payer: Self-pay | Admitting: *Deleted

## 2014-05-03 ENCOUNTER — Inpatient Hospital Stay (HOSPITAL_COMMUNITY): Admission: RE | Admit: 2014-05-03 | Payer: MEDICAID | Source: Ambulatory Visit

## 2014-05-03 DIAGNOSIS — O09299 Supervision of pregnancy with other poor reproductive or obstetric history, unspecified trimester: Secondary | ICD-10-CM

## 2014-05-03 MED ORDER — DIPHENHYDRAMINE HCL 25 MG PO CAPS: 25.0000 mg | ORAL_CAPSULE | Freq: Four times a day (QID) | ORAL | Status: DC | PRN

## 2014-05-03 MED ORDER — ONDANSETRON HCL 4 MG/2ML IJ SOLN: 4.0000 mg | INTRAMUSCULAR | Status: DC | PRN

## 2014-05-03 MED ORDER — ONDANSETRON HCL 4 MG PO TABS: 4.0000 mg | ORAL_TABLET | ORAL | Status: DC | PRN

## 2014-05-03 MED ORDER — IBUPROFEN 600 MG PO TABS
600.0000 mg | ORAL_TABLET | Freq: Four times a day (QID) | ORAL | Status: DC
  Administered 2014-05-03 – 2014-05-04 (×6): 600 mg via ORAL
  Filled 2014-05-03 (×6): qty 1

## 2014-05-03 MED ORDER — TETANUS-DIPHTH-ACELL PERTUSSIS 5-2.5-18.5 LF-MCG/0.5 IM SUSP
0.5000 mL | Freq: Once | INTRAMUSCULAR | Status: AC
  Administered 2014-05-03: 0.5 mL via INTRAMUSCULAR

## 2014-05-03 MED ORDER — LANOLIN HYDROUS EX OINT: TOPICAL_OINTMENT | CUTANEOUS | Status: DC | PRN

## 2014-05-03 MED ORDER — WITCH HAZEL-GLYCERIN EX PADS: 1.0000 "application " | MEDICATED_PAD | CUTANEOUS | Status: DC | PRN

## 2014-05-03 MED ORDER — PRENATAL MULTIVITAMIN CH
1.0000 | ORAL_TABLET | Freq: Every day | ORAL | Status: DC
  Administered 2014-05-03 – 2014-05-04 (×2): 1 via ORAL
  Filled 2014-05-03 (×2): qty 1

## 2014-05-03 MED ORDER — BENZOCAINE-MENTHOL 20-0.5 % EX AERO: 1.0000 "application " | INHALATION_SPRAY | CUTANEOUS | Status: DC | PRN

## 2014-05-03 MED ORDER — OXYCODONE-ACETAMINOPHEN 5-325 MG PO TABS: 1.0000 | ORAL_TABLET | ORAL | Status: DC | PRN

## 2014-05-03 MED ORDER — SIMETHICONE 80 MG PO CHEW: 80.0000 mg | CHEWABLE_TABLET | ORAL | Status: DC | PRN

## 2014-05-03 MED ORDER — ZOLPIDEM TARTRATE 5 MG PO TABS: 5.0000 mg | ORAL_TABLET | Freq: Every evening | ORAL | Status: DC | PRN

## 2014-05-03 MED ORDER — DIBUCAINE 1 % RE OINT: 1.0000 "application " | TOPICAL_OINTMENT | RECTAL | Status: DC | PRN

## 2014-05-03 MED ORDER — SENNOSIDES-DOCUSATE SODIUM 8.6-50 MG PO TABS
2.0000 | ORAL_TABLET | ORAL | Status: DC
  Administered 2014-05-03: 2 via ORAL
  Filled 2014-05-03: qty 2

## 2014-05-03 NOTE — Progress Notes (Addendum)
[name] is a 33 y.o. G3P1101 at 4225w0d admitted for vaginal bleeding with hx IUFD  Subjective: Pt breathing with contractions, family in room for support.  Objective: BP 114/60  Pulse 56  Temp(Src) 98 F (36.7 C) (Oral)  Resp 18  Ht 5\' 2"  (1.575 m)  Wt 63.504 kg (140 lb)  BMI 25.60 kg/m2  LMP 08/03/2013      FHT:  FHR: 135 bpm, variability: moderate,  accelerations:  Present,  decelerations:  Absent UC:   regular, every 3 minutes SVE:   Dilation: 4 Effacement (%): 90 Station: -1 Exam by:: A.Davis,RN  Labs: Lab Results  Component Value Date   WBC 6.7 05/02/2014   HGB 11.6* 05/02/2014   HCT 33.6* 05/02/2014   MCV 89.8 05/02/2014   PLT 240 05/02/2014    Assessment / Plan: Augmentation of labor, progressing well  Labor: Progressing normally Preeclampsia:  n/a Fetal Wellbeing:  Category I Pain Control:  Labor support without medications I/D:  n/a Anticipated MOD:  NSVD  [name], [name] 05/03/2014, 1:02 AM

## 2014-05-03 NOTE — Lactation Note (Signed)
This note was copied from the chart of Raven Korrina Kathrene AluOrellana. Lactation Consultation Note  Patient Name: [name] ZOXWR'UToday's Date: 05/03/2014 Reason for consult: Initial assessment Baby 11 hours of life. Mom reports breastfeeding going very well. Mom states that she breastfed first child without any issues. Enc mom to offer lots of STS, feed with cues and at least 8-12 times/24 hours. [facility], aware of OP/BFSG and community resources. Enc mom to call out for assistance as needed.   Maternal Data Has patient been taught Hand Expression?: Yes Does the patient have breastfeeding experience prior to this delivery?: Yes  Feeding    LATCH Score/Interventions                      Lactation Tools Discussed/Used     Consult Status Consult Status: Follow-up Date: 05/04/14 Follow-up type: In-patient    Geralynn OchsWILLIARD, [name] 05/03/2014, 3:02 PM

## 2014-05-03 NOTE — Progress Notes (Signed)
UR completed 

## 2014-05-03 NOTE — Plan of Care (Signed)
Problem: Phase I Progression Outcomes Goal: Other Phase I Outcomes/Goals Outcome: Completed/Met Date Met:  05/03/14 Patient doing well. Continue current care. Patient ambulating in hallways.

## 2014-05-04 ENCOUNTER — Ambulatory Visit: Payer: Self-pay

## 2014-05-04 NOTE — Lactation Note (Signed)
This note was copied from the chart of Raven Journee Kathrene AluOrellana. Lactation Consultation Note  Patient Name: Raven Beverly GustDilma Orellana XBMWU'XToday's Date: 05/04/2014 Reason for consult: Follow-up assessment  Upon entering room, infant lying on bed on top of pillow, mom in bathroom and dad engaged in conversation on phone and with computer.  Mom came out of bathroom and LC discussed with mom safe sleep practices and encouraged her to put baby in bassinet if mom is not with baby.  Mom denied any pain or breastfeeding problems.  Hand pump given for discharge use and explained how to use to prevent engorgement.  Encouraged mom to call for questions if needed.    Consult Status Consult Status: Complete    [name], [name] 05/04/2014, 2:24 PM

## 2014-05-04 NOTE — Discharge Summary (Signed)
Obstetric Discharge Summary Reason for Admission: induction of labor Prenatal Procedures: none Intrapartum Procedures: spontaneous vaginal delivery Postpartum Procedures: none Complications-Operative and Postpartum: 1st degree perineal laceration Hemoglobin  Date Value Ref Range Status  05/02/2014 11.6* 12.0 - 15.0 g/dL Final     HCT  Date Value Ref Range Status  05/02/2014 33.6* 36.0 - 46.0 % Final    Discharge Diagnoses: Term Pregnancy-delivered  Hospital Course:  [name] is a 33 y.o. [id number] who presented vaginal bleeding and was scheduled for IOL 2/2 IUFD. She had a uncomplicated SVD. She was able to ambulate, tolerate PO and void normally. She was discharged home with instructions for postpartum care.    Delivery Note  At 3:15 AM a healthy female was delivered via Vaginal, Spontaneous Delivery (Presentation: ; ). APGAR: 9, 9; weight .  Placenta status: Intact, Spontaneous. Cord: 3 vessels with the following complications: None. Cord pH: not obtained  Anesthesia: Local  Episiotomy: None  Lacerations: Right periuretheral  Suture Repair: vicryl rapide  Est. Blood Loss (mL): 250  Upon arrival patient was complete and pushing. She pushed with good maternal effort to deliver a healthy boy. Mild shoulder dystocia noted with McRoberts only to aid delivery. Baby was noted to have good tone and placed on maternal abdomen for drying and stimulation. Delayed cord clamping performed and cut by FOB. Placenta delivered intact with 3V cord. Vaginal canal and perineum was inspected and superficial right periurethral laceration was found and repaired in the usual fashion by Dr Gayla DossJoyner which was hemostatic on completion. Pitocin was started and uterus massaged until bleeding slowed. Counts of sharps, instruments, and lap pads were all correct.  Mom to postpartum. Baby to Couplet care / Skin to Skin.  [name], James  05/03/2014, 3:43 AM  I was present for this delivery and agree with the above  resident's note.  Sharen CounterLisa [name]  Certified Nurse-Midwife  Physical Exam:  General: alert and cooperative Lochia: appropriate Uterine Fundus: firm DVT Evaluation: No evidence of DVT seen on physical exam. Negative Homan's sign. No cords or calf tenderness. No significant calf/ankle edema.  Discharge Information: Date: 05/04/2014 Activity: unrestricted Diet: routine Medications: Ibuprofen Baby feeding: plans to breastfeed Contraception: Depo-Provera, considering IUD Condition: stable Instructions: refer to practice specific booklet Discharge to: home   Newborn Data: Live born female  Birth Weight: 7 lb 8.1 oz (3405 g) APGAR: 9, 9  Home with mother.  Wenda LowJames Joyner, MD [facility] PGY-1 05/04/2014, 7:18 AM  Post Partum Day 1 I have seen and examined this patient and agree with above documentation in the resident's note.  [name] is a 33 y.o. [id number] s/p SVD.  Pt denies problems with ambulating, voiding or po intake. Pain is well controlled.  .  Plan for birth control is IUD.  Method of Feeding: breast PE:  BP 95/57  Pulse 62  Temp(Src) 98.2 F (36.8 C) (Oral)  Resp 18  Ht 5\' 2"  (1.575 m)  Wt 140 lb (63.504 kg)  BMI 25.60 kg/m2  LMP 08/03/2013  Breastfeeding? Unknown Fundus firm Plan for discharge: today  Perry MountACOSTA,Ebba Goll ROCIO, MD

## 2014-05-04 NOTE — Discharge Instructions (Signed)

## 2014-05-16 ENCOUNTER — Inpatient Hospital Stay (HOSPITAL_COMMUNITY)
Admission: AD | Admit: 2014-05-16 | Discharge: 2014-05-16 | Disposition: A | Discharge status: Home / Self Care | Payer: Self-pay | Source: Ambulatory Visit | Attending: Obstetrics and Gynecology | Admitting: Obstetrics and Gynecology

## 2014-05-16 ENCOUNTER — Encounter (HOSPITAL_COMMUNITY): Payer: Self-pay

## 2014-05-16 DIAGNOSIS — O99893 Other specified diseases and conditions complicating puerperium: Secondary | ICD-10-CM | POA: Insufficient documentation

## 2014-05-16 DIAGNOSIS — O9989 Other specified diseases and conditions complicating pregnancy, childbirth and the puerperium: Principal | ICD-10-CM

## 2014-05-16 DIAGNOSIS — N949 Unspecified condition associated with female genital organs and menstrual cycle: Secondary | ICD-10-CM | POA: Insufficient documentation

## 2014-05-16 DIAGNOSIS — O909 Complication of the puerperium, unspecified: Secondary | ICD-10-CM

## 2014-05-16 DIAGNOSIS — O9089 Other complications of the puerperium, not elsewhere classified: Secondary | ICD-10-CM

## 2014-05-16 LAB — URINE MICROSCOPIC-ADD ON

## 2014-05-16 LAB — URINALYSIS, ROUTINE W REFLEX MICROSCOPIC
Bilirubin Urine: NEGATIVE
GLUCOSE, UA: NEGATIVE mg/dL
KETONES UR: 15 mg/dL — AB
Nitrite: NEGATIVE
PROTEIN: NEGATIVE mg/dL
Specific Gravity, Urine: 1.015 (ref 1.005–1.030)
UROBILINOGEN UA: 1 mg/dL (ref 0.0–1.0)
pH: 7 (ref 5.0–8.0)

## 2014-05-16 LAB — CBC
HCT: 35.5 % — ABNORMAL LOW (ref 36.0–46.0)
Hemoglobin: 12 g/dL (ref 12.0–15.0)
MCH: 30.5 pg (ref 26.0–34.0)
MCHC: 33.8 g/dL (ref 30.0–36.0)
MCV: 90.3 fL (ref 78.0–100.0)
PLATELETS: 279 10*3/uL (ref 150–400)
RBC: 3.93 MIL/uL (ref 3.87–5.11)
RDW: 12.7 % (ref 11.5–15.5)
WBC: 6 10*3/uL (ref 4.0–10.5)

## 2014-05-16 LAB — WET PREP, GENITAL
Clue Cells Wet Prep HPF POC: NONE SEEN
TRICH WET PREP: NONE SEEN
YEAST WET PREP: NONE SEEN

## 2014-05-16 NOTE — MAU Note (Signed)
Vag delivery on 8/18, states she went to BR to void yesterday, felt something "come down", states it looked like dark red tissue, also happened this morning.  Feels pinching & cramping in vagina.

## 2014-05-16 NOTE — MAU Provider Note (Signed)
Chief Complaint: Vaginal Bleeding   First Provider Initiated Contact with Patient 05/16/14 1907     SUBJECTIVE HPI: [name] is a 33 y.o. Z6X0960 on PPD # 77 after NSVD who presents to maternity admissions reporting passing tissue from the vagina today. She reports an increase in cramping and slightly heavier bleeding yesterday.  She denies heavy bleeding, and was not changing pads more often than every 2 hours.  She reports she took pictures of the tissue, which is described as dark red and about 4-5 cm in diameter.  She left the phone at home with the pictures.  She is concerned about retained placenta.  She reports chills on the 3rd day postpartum, but no fever, and no chills since that day.  She denies abdominal pain today, denies vaginal itching/burning, urinary symptoms, h/a, dizziness, n/v, or fever/chills.    Past Medical History  Diagnosis Date  . Oligohydramnios   . Preterm labor    Past Surgical History  Procedure Laterality Date  . No past surgeries     History   Social History  . Marital Status: Single    Spouse Name: N/A    Number of Children: N/A  . Years of Education: N/A   Occupational History  . Not on file.   Social History Main Topics  . Smoking status: Never Smoker   . Smokeless tobacco: Never Used  . Alcohol Use: No  . Drug Use: No  . Sexual Activity: Not Currently    Birth Control/ Protection: Rhythm     Comment: last sex several months ago   Other Topics Concern  . Not on file   Social History Narrative  . No narrative on file   No current facility-administered medications on file prior to encounter.   No current outpatient prescriptions on file prior to encounter.   No Known Allergies  ROS: Pertinent items in HPI  OBJECTIVE Blood pressure 109/66, pulse 67, temperature 98.4 F (36.9 C), temperature source Oral, resp. rate 20, unknown if currently breastfeeding. GENERAL: Well-developed, well-nourished female in no acute distress.   HEENT: Normocephalic HEART: normal rate RESP: normal effort ABDOMEN: Soft, non-tender EXTREMITIES: Nontender, no edema NEURO: Alert and oriented Pelvic exam: Cervix pink, visually closed, without lesion, light dark red blood with mucous, no clots noted, vaginal walls and external genitalia normal Bimanual exam: Cervix 0/long/high, firm, anterior, neg CMT, uterus nontender, slightly enlarged, adnexa without tenderness, enlargement, or mass  LAB RESULTS Results for orders placed during the hospital encounter of 05/16/14 (from the past 24 hour(s))  URINALYSIS, ROUTINE W REFLEX MICROSCOPIC     Status: Abnormal   Collection Time    05/16/14  4:50 PM      Result Value Ref Range   Color, Urine YELLOW  YELLOW   APPearance CLEAR  CLEAR   Specific Gravity, Urine 1.015  1.005 - 1.030   pH 7.0  5.0 - 8.0   Glucose, UA NEGATIVE  NEGATIVE mg/dL   Hgb urine dipstick LARGE (*) NEGATIVE   Bilirubin Urine NEGATIVE  NEGATIVE   Ketones, ur 15 (*) NEGATIVE mg/dL   Protein, ur NEGATIVE  NEGATIVE mg/dL   Urobilinogen, UA 1.0  0.0 - 1.0 mg/dL   Nitrite NEGATIVE  NEGATIVE   Leukocytes, UA SMALL (*) NEGATIVE  URINE MICROSCOPIC-ADD ON     Status: Abnormal   Collection Time    05/16/14  4:50 PM      Result Value Ref Range   Squamous Epithelial / LPF FEW (*) RARE  WBC, UA 7-10  <3 WBC/hpf   RBC / HPF 3-6  <3 RBC/hpf    ASSESSMENT 1. Postpartum complication     PLAN Discharge home Reassurance provided that no abnormalities on exam, no pain or fever unlikely retained products Likely blood clot seen today, may have been why she had more cramping before the clot Wet prep pending Urine culture pending       Follow-up Information   Follow up with [facility] [place] [facility]. (As needed for emergencies)    Contact information:   [address] [id number] [place] [address] [phone]      Follow up with [facility]. (As scheduled for  postpartum visit)    Specialty:  Obstetrics and Gynecology   Contact information:   [address] [place] [address] [phone]      Sharen Counter Certified Nurse-Midwife 05/16/2014  7:29 PM

## 2014-05-16 NOTE — Discharge Instructions (Signed)

## 2014-05-20 NOTE — MAU Provider Note (Signed)
Attestation of Attending Supervision of Advanced Practitioner (CNM/NP): Evaluation and management procedures were performed by the Advanced Practitioner under my supervision and collaboration.  I have reviewed the Advanced Practitioner's note and chart, and I agree with the management and plan.  Kiyoshi Schaab 05/20/2014 8:54 AM

## 2014-06-08 ENCOUNTER — Encounter: Payer: Self-pay | Admitting: *Deleted

## 2014-06-08 ENCOUNTER — Telehealth: Payer: Self-pay | Admitting: *Deleted

## 2014-06-08 ENCOUNTER — Encounter: Payer: Self-pay | Admitting: Obstetrics & Gynecology

## 2014-06-08 ENCOUNTER — Ambulatory Visit: Payer: Self-pay | Admitting: Obstetrics & Gynecology

## 2014-06-08 NOTE — Telephone Encounter (Signed)
[name] missed an appointment for her postpartum visit. Called both her home and mobile phone numbers and left messages she had missed an appointment, please call to reschedule. Will also send letter.

## 2014-07-18 ENCOUNTER — Encounter (HOSPITAL_COMMUNITY): Payer: Self-pay

## 2014-08-10 ENCOUNTER — Encounter: Payer: Self-pay | Admitting: Obstetrics & Gynecology

## 2018-02-27 ENCOUNTER — Inpatient Hospital Stay (HOSPITAL_COMMUNITY)
Admission: AD | Admit: 2018-02-27 | Discharge: 2018-02-27 | Disposition: A | Discharge status: Home / Self Care | Payer: Self-pay | Source: Ambulatory Visit | Attending: Obstetrics and Gynecology | Admitting: Obstetrics and Gynecology

## 2018-02-27 ENCOUNTER — Inpatient Hospital Stay (HOSPITAL_COMMUNITY): Payer: Self-pay

## 2018-02-27 ENCOUNTER — Encounter (HOSPITAL_COMMUNITY): Payer: Self-pay

## 2018-02-27 DIAGNOSIS — O26891 Other specified pregnancy related conditions, first trimester: Secondary | ICD-10-CM | POA: Insufficient documentation

## 2018-02-27 DIAGNOSIS — O26899 Other specified pregnancy related conditions, unspecified trimester: Secondary | ICD-10-CM

## 2018-02-27 DIAGNOSIS — Z3A01 Less than 8 weeks gestation of pregnancy: Secondary | ICD-10-CM | POA: Insufficient documentation

## 2018-02-27 DIAGNOSIS — R109 Unspecified abdominal pain: Secondary | ICD-10-CM | POA: Insufficient documentation

## 2018-02-27 LAB — POCT PREGNANCY, URINE: Preg Test, Ur: POSITIVE — AB

## 2018-02-27 LAB — CBC WITH DIFFERENTIAL/PLATELET
Basophils Absolute: 0 10*3/uL (ref 0.0–0.1)
Basophils Relative: 0 %
EOS PCT: 1 %
Eosinophils Absolute: 0.1 10*3/uL (ref 0.0–0.7)
HCT: 38 % (ref 36.0–46.0)
Hemoglobin: 13.4 g/dL (ref 12.0–15.0)
LYMPHS PCT: 34 %
Lymphs Abs: 1.9 10*3/uL (ref 0.7–4.0)
MCH: 31.3 pg (ref 26.0–34.0)
MCHC: 35.3 g/dL (ref 30.0–36.0)
MCV: 88.8 fL (ref 78.0–100.0)
MONOS PCT: 4 %
Monocytes Absolute: 0.2 10*3/uL (ref 0.1–1.0)
Neutro Abs: 3.4 10*3/uL (ref 1.7–7.7)
Neutrophils Relative %: 61 %
Platelets: 255 10*3/uL (ref 150–400)
RBC: 4.28 MIL/uL (ref 3.87–5.11)
RDW: 12.5 % (ref 11.5–15.5)
WBC: 5.6 10*3/uL (ref 4.0–10.5)

## 2018-02-27 LAB — URINALYSIS, ROUTINE W REFLEX MICROSCOPIC
BILIRUBIN URINE: NEGATIVE
Glucose, UA: NEGATIVE mg/dL
HGB URINE DIPSTICK: NEGATIVE
Ketones, ur: NEGATIVE mg/dL
Leukocytes, UA: NEGATIVE
Nitrite: NEGATIVE
Protein, ur: NEGATIVE mg/dL
SPECIFIC GRAVITY, URINE: 1.024 (ref 1.005–1.030)
pH: 5 (ref 5.0–8.0)

## 2018-02-27 LAB — COMPREHENSIVE METABOLIC PANEL
ALT: 20 U/L (ref 14–54)
AST: 22 U/L (ref 15–41)
Albumin: 4 g/dL (ref 3.5–5.0)
Alkaline Phosphatase: 39 U/L (ref 38–126)
Anion gap: 9 (ref 5–15)
BUN: 12 mg/dL (ref 6–20)
CHLORIDE: 107 mmol/L (ref 101–111)
CO2: 21 mmol/L — ABNORMAL LOW (ref 22–32)
CREATININE: 0.39 mg/dL — AB (ref 0.44–1.00)
Calcium: 8.9 mg/dL (ref 8.9–10.3)
GFR calc non Af Amer: >60 mL/min (ref >60)
Glucose, Bld: 99 mg/dL (ref 65–99)
Potassium: 3.4 mmol/L — ABNORMAL LOW (ref 3.5–5.1)
Sodium: 137 mmol/L (ref 135–145)
Total Bilirubin: 0.3 mg/dL (ref 0.3–1.2)
Total Protein: 7.4 g/dL (ref 6.5–8.1)

## 2018-02-27 LAB — WET PREP, GENITAL
CLUE CELLS WET PREP: NONE SEEN
Sperm: NONE SEEN
Trich, Wet Prep: NONE SEEN
WBC, Wet Prep HPF POC: NONE SEEN
Yeast Wet Prep HPF POC: NONE SEEN

## 2018-02-27 LAB — HCG, QUANTITATIVE, PREGNANCY: hCG, Beta Chain, Quant, S: 130747 m[IU]/mL — ABNORMAL HIGH (ref <5)

## 2018-02-27 LAB — ABO/RH: ABO/RH(D): O POS

## 2018-02-27 NOTE — MAU Note (Signed)
Pt presents to MAU with c/o lower left abdominal pain, lower back pain and intermittent headache that started x 3 weeks ago, that has progressively become more painful.  Pt had +HPT. Pt denies VB.

## 2018-02-27 NOTE — Discharge Instructions (Signed)

## 2018-02-27 NOTE — MAU Provider Note (Signed)
History     CSN: [id number]  Arrival date and time: 02/27/18 1046   First Provider Initiated Contact with Patient 02/27/18 1205      Chief Complaint  Patient presents with  . Abdominal Pain  . Headache  . Back Pain   HPI   [name] is a 37 y.o. female [id number] @ 5w2dhere in MAU with LLQ pain that radiates around to the left side of her lower back. The pain started 3 weeks ago and has gotten progressively worse. Currently she rates her pain 4/10, The pain comes and goes. The pain is better when she is sitting or driving. Says she has not taken any pain medication for the pain.   OB History    Gravida  4   Para  3   Term  2   Preterm  1   AB  0   Living  2     SAB  0   TAB  0   Ectopic  0   Multiple  0   Live Births  2           Past Medical History:  Diagnosis Date  . Oligohydramnios   . Preterm labor     Past Surgical History:  Procedure Laterality Date  . NO PAST SURGERIES      Family History  Problem Relation Age of Onset  . Cancer Mother   . Alcohol abuse Brother   . Asthma Brother   . Birth defects Brother   . Alcohol abuse Paternal Aunt   . Miscarriages / Stillbirths Maternal Grandmother   . Heart disease Maternal Grandfather     Social History   Tobacco Use  . Smoking status: Never Smoker  . Smokeless tobacco: Never Used  Substance Use Topics  . Alcohol use: No  . Drug use: No    Allergies: No Known Allergies  No medications prior to admission.   Results for orders placed or performed during the hospital encounter of 02/27/18 (from the past 48 hour(s))  Urinalysis, Routine w reflex microscopic     Status: None   Collection Time: 02/27/18 11:24 AM  Result Value Ref Range   Color, Urine YELLOW YELLOW   APPearance CLEAR CLEAR   Specific Gravity, Urine 1.024 1.005 - 1.030   pH 5.0 5.0 - 8.0   Glucose, UA NEGATIVE NEGATIVE mg/dL   Hgb urine dipstick NEGATIVE NEGATIVE   Bilirubin Urine NEGATIVE NEGATIVE    Ketones, ur NEGATIVE NEGATIVE mg/dL   Protein, ur NEGATIVE NEGATIVE mg/dL   Nitrite NEGATIVE NEGATIVE   Leukocytes, UA NEGATIVE NEGATIVE    Comment: Performed at [facility] [address], [place] [address] Pregnancy, urine POC     Status: Abnormal   Collection Time: 02/27/18 11:29 AM  Result Value Ref Range   Preg Test, Ur POSITIVE (A) NEGATIVE    Comment:        THE SENSITIVITY OF THIS METHODOLOGY IS >24 mIU/mL   CBC with Differential/Platelet     Status: None   Collection Time: 02/27/18 12:26 PM  Result Value Ref Range   WBC 5.6 4.0 - 10.5 K/uL   RBC 4.28 3.87 - 5.11 MIL/uL   Hemoglobin 13.4 12.0 - 15.0 g/dL   HCT 38.0 36.0 - 46.0 %   MCV 88.8 78.0 - 100.0 fL   MCH 31.3 26.0 - 34.0 pg   MCHC 35.3 30.0 - 36.0 g/dL   RDW 12.5 11.5 - 15.5 %  Platelets 255 150 - 400 K/uL   Neutrophils Relative % 61 %   Neutro Abs 3.4 1.7 - 7.7 K/uL   Lymphocytes Relative 34 %   Lymphs Abs 1.9 0.7 - 4.0 K/uL   Monocytes Relative 4 %   Monocytes Absolute 0.2 0.1 - 1.0 K/uL   Eosinophils Relative 1 %   Eosinophils Absolute 0.1 0.0 - 0.7 K/uL   Basophils Relative 0 %   Basophils Absolute 0.0 0.0 - 0.1 K/uL    Comment: Performed at [facility], [address], [place], [place] [address]  Comprehensive metabolic panel     Status: Abnormal   Collection Time: 02/27/18 12:26 PM  Result Value Ref Range   Sodium 137 135 - 145 mmol/L   Potassium 3.4 (L) 3.5 - 5.1 mmol/L   Chloride 107 101 - 111 mmol/L   CO2 21 (L) 22 - 32 mmol/L   Glucose, Bld 99 65 - 99 mg/dL   BUN 12 6 - 20 mg/dL   Creatinine, Ser 0.39 (L) 0.44 - 1.00 mg/dL   Calcium 8.9 8.9 - 10.3 mg/dL   Total Protein 7.4 6.5 - 8.1 g/dL   Albumin 4.0 3.5 - 5.0 g/dL   AST 22 15 - 41 U/L   ALT 20 14 - 54 U/L   Alkaline Phosphatase 39 38 - 126 U/L   Total Bilirubin 0.3 0.3 - 1.2 mg/dL   GFR calc non Af Amer >60 >60 mL/min   GFR calc Af Amer >60 >60 mL/min    Comment: (NOTE) The eGFR has been calculated using the  CKD EPI equation. This calculation has not been validated in all clinical situations. eGFR's persistently <60 mL/min signify possible Chronic Kidney Disease.    Anion gap 9 5 - 15    Comment: Performed at [facility], [address], [place], [place] [address]  ABO/Rh     Status: None   Collection Time: 02/27/18 12:26 PM  Result Value Ref Range   ABO/RH(D)      O POS Performed at [facility], [address], [place], [place] [address]   hCG, quantitative, pregnancy     Status: Abnormal   Collection Time: 02/27/18 12:26 PM  Result Value Ref Range   hCG, Beta Chain, Quant, S 130,747 (H) <5 mIU/mL    Comment:          GEST. AGE      CONC.  (mIU/mL)   <=1 WEEK        5 - 50     2 WEEKS       50 - 500     3 WEEKS       100 - 10,000     4 WEEKS     1,000 - 30,000     5 WEEKS     3,500 - 115,000   6-8 WEEKS     12,000 - 270,000    12 WEEKS     15,000 - 220,000        FEMALE AND NON-PREGNANT FEMALE:     LESS THAN 5 mIU/mL Performed at [facility], [address] [place], [place] [address]   Wet prep, genital     Status: None   Collection Time: 02/27/18  2:55 PM  Result Value Ref Range   Yeast Wet Prep HPF POC NONE SEEN NONE SEEN   Trich, Wet Prep NONE SEEN NONE SEEN   Clue Cells Wet Prep HPF POC NONE SEEN NONE SEEN   WBC, Wet Prep HPF  POC NONE SEEN NONE SEEN   Sperm NONE SEEN     Comment: Performed at [facility], [address], [place], [place] [address]    Review of Systems  Constitutional: Negative for fever.  Gastrointestinal: Positive for abdominal pain (Worse in LLQ). Negative for nausea and vomiting.  Genitourinary: Negative for vaginal bleeding.  Physical Exam Physical Exam   Blood pressure 101/73, pulse 70, temperature 98.1 F (36.7 C), temperature source Oral, resp. rate 18, height '5\' 1"'  (1.549 m), weight 128 lb (58.1 kg), last menstrual period 01/07/2018, unknown if currently breastfeeding.  Physical Exam  Constitutional: She is oriented to  person, place, and time. She appears well-developed and well-nourished. No distress.  HENT:  Head: Normocephalic.  Eyes: Pupils are equal, round, and reactive to light.  Cardiovascular: Normal rate.  Respiratory: Effort normal.  GI: Soft.  Genitourinary:  Genitourinary Comments: Bimanual exam: Cervix closed Uterus non tender, enlarged  Adnexa non tender, no masses bilaterally GC/Chlam, wet prep done Chaperone present for exam.   Musculoskeletal: Normal range of motion.  Neurological: She is alert and oriented to person, place, and time.  Skin: Skin is warm. She is not diaphoretic.  Psychiatric: Her behavior is normal.   MAU Course  Procedures  None  MDM   Wet prep & GC HIV, CBC, Hcg, ABO US OB transvaginal  O positive blood type   Assessment and Plan   A:  1. Abdominal pain in pregnancy, first trimester   2. Abdominal pain in pregnancy   3. [redacted] weeks gestation of pregnancy     P:  Discharge home in stable condition Return to MAU if symptoms worsen First trimester warning signs Start prenatal care   Rasch, Artist Pais, NP 02/27/2018 4:14 PM

## 2018-02-28 LAB — GC/CHLAMYDIA PROBE AMP (~~LOC~~) NOT AT ARMC
Chlamydia: NEGATIVE
Neisseria Gonorrhea: NEGATIVE

## 2018-03-25 ENCOUNTER — Encounter: Payer: Self-pay | Admitting: General Practice

## 2018-03-25 ENCOUNTER — Encounter: Payer: Self-pay | Admitting: Obstetrics and Gynecology

## 2018-03-25 ENCOUNTER — Ambulatory Visit (INDEPENDENT_AMBULATORY_CARE_PROVIDER_SITE_OTHER): Payer: Self-pay | Admitting: Obstetrics and Gynecology

## 2018-03-25 VITALS — BP 107/64 | HR 73 | Wt 125.5 lb

## 2018-03-25 DIAGNOSIS — Z113 Encounter for screening for infections with a predominantly sexual mode of transmission: Secondary | ICD-10-CM

## 2018-03-25 DIAGNOSIS — O0991 Supervision of high risk pregnancy, unspecified, first trimester: Secondary | ICD-10-CM

## 2018-03-25 DIAGNOSIS — O09521 Supervision of elderly multigravida, first trimester: Secondary | ICD-10-CM

## 2018-03-25 DIAGNOSIS — Z87898 Personal history of other specified conditions: Secondary | ICD-10-CM

## 2018-03-25 DIAGNOSIS — O0990 Supervision of high risk pregnancy, unspecified, unspecified trimester: Secondary | ICD-10-CM | POA: Insufficient documentation

## 2018-03-25 DIAGNOSIS — Z8759 Personal history of other complications of pregnancy, childbirth and the puerperium: Secondary | ICD-10-CM

## 2018-03-25 DIAGNOSIS — Z1151 Encounter for screening for human papillomavirus (HPV): Secondary | ICD-10-CM

## 2018-03-25 DIAGNOSIS — Z124 Encounter for screening for malignant neoplasm of cervix: Secondary | ICD-10-CM

## 2018-03-25 DIAGNOSIS — O09529 Supervision of elderly multigravida, unspecified trimester: Secondary | ICD-10-CM | POA: Insufficient documentation

## 2018-03-25 DIAGNOSIS — Z8742 Personal history of other diseases of the female genital tract: Secondary | ICD-10-CM

## 2018-03-25 DIAGNOSIS — N841 Polyp of cervix uteri: Secondary | ICD-10-CM

## 2018-03-25 NOTE — Progress Notes (Signed)
INITIAL PRENATAL VISIT NOTE  Subjective:  [name] is a 37 y.o. [id number] at [redacted]w[redacted]d by 8 weeks US at [facility] being seen today for her initial prenatal visit. This is an unplanned pregnancy. She and partner are happy with the pregnancy. She was using OCPs for birth control previously. She has an obstetric history significant for 1 x IUFD. She has a medical history significant for n/a.  Patient reports occasional cramping.  Contractions: Not present. Vag. Bleeding: None, Scant.  Movement: Absent. Denies leaking of fluid. Reports light pink discharge particularly that she notices when she walks fast.   Past Medical History:  Diagnosis Date  . Oligohydramnios   . Preterm labor     Past Surgical History:  Procedure Laterality Date  . NO PAST SURGERIES      OB History  Gravida Para Term Preterm AB Living  4 3 2 1  0 2  SAB TAB Ectopic Multiple Live Births  0 0 0 0 2    # Outcome Date GA Lbr Len/2nd Weight Sex Delivery Anes PTL Lv  4 Current           3 Term 05/03/14 9275w0d 12:18 / 00:27 7 lb 8.1 oz (3.405 kg) M Vag-Spont Local  LIV  2 Term 02/23/09 3455w0d  7 lb (3.175 kg) M Vag-Spont None  LIV  1 Preterm 2001 239w0d       FD     Birth Comments: no fluid and nuchal cord    Social History   Socioeconomic History  . Marital status: Married    Spouse name: Not on file  . Number of children: Not on file  . Years of education: Not on file  . Highest education level: Not on file  Occupational History  . Not on file  Social Needs  . Financial resource strain: Not on file  . Food insecurity:    Worry: Not on file    Inability: Not on file  . Transportation needs:    Medical: Not on file    Non-medical: Not on file  Tobacco Use  . Smoking status: Never Smoker  . Smokeless tobacco: Never Used  Substance and Sexual Activity  . Alcohol use: No  . Drug use: No  . Sexual activity: Not Currently    Birth control/protection: None  Lifestyle  . Physical activity:    Days  per week: Not on file    Minutes per session: Not on file  . Stress: Not on file  Relationships  . Social connections:    Talks on phone: Not on file    Gets together: Not on file    Attends religious service: Not on file    Active member of club or organization: Not on file    Attends meetings of clubs or organizations: Not on file    Relationship status: Not on file  Other Topics Concern  . Not on file  Social History Narrative  . Not on file    Family History  Problem Relation Age of Onset  . Cancer Mother   . Alcohol abuse Brother   . Asthma Brother   . Birth defects Brother   . Alcohol abuse Paternal Aunt   . Miscarriages / Stillbirths Maternal Grandmother   . Heart disease Maternal Grandfather     (Not in a hospital admission)  No Known Allergies  Review of Systems: Negative except for what is mentioned in HPI.  Objective:   Vitals:   03/25/18 1059  BP:  107/64  Pulse: 73  Weight: 125 lb 8 oz (56.9 kg)   Fetal Status: Fetal Heart Rate (bpm): 149   Movement: Absent     Physical Exam: BP 107/64   Pulse 73   Wt 125 lb 8 oz (56.9 kg)   LMP 01/07/2018 (Approximate)   BMI 23.71 kg/m  CONSTITUTIONAL: Well-developed, well-nourished female in no acute distress.  NEUROLOGIC: Alert and oriented to person, place, and time. Normal reflexes, muscle tone coordination. No cranial nerve deficit noted. PSYCHIATRIC: Normal mood and affect. Normal behavior. Normal judgment and thought content. SKIN: Skin is warm and dry. No rash noted. Not diaphoretic. No erythema. No pallor. HENT:  Normocephalic, atraumatic, External right and left ear normal. Oropharynx is clear and moist EYES: Conjunctivae and EOM are normal. Pupils are equal, round, and reactive to light. No scleral icterus.  NECK: Normal range of motion, supple, no masses CARDIOVASCULAR: Normal heart rate noted, regular rhythm RESPIRATORY: Effort and breath sounds normal, no problems with respiration noted BREASTS:  not examined ABDOMEN: Soft, nontender, nondistended, gravid. GU: normal appearing external female genitalia, multiparous normal appearing cervix with 0.5 cm protruding just past cervical os, stalk not visible, no bleeding noted, scant white discharge in vagina, no lesions noted Bimanual: 12 weeks sized uterus, no adnexal tenderness or palpable lesions noted MUSCULOSKELETAL: Normal range of motion. EXT:  No edema and no tenderness. 2+ distal pulses.   Assessment and Plan:  Pregnancy: G4P2102 at [redacted]w[redacted]d by 8 weeks [place]  1. History of IUFD IUFD at 36 weeks, induced due to oligo, and nuchal cord reported She reports she was induced at 37 weeks due to oligo in third pregnancy Will need 3rd trim testing  2. Supervision of high risk pregnancy, antepartum - Obstetric Panel, Including HIV - SMN1 COPY NUMBER ANALYSIS (SMA Carrier Screen) - Culture, OB Urine - Genetic Screening - [facility] AMB BABYSCRIPTS OPT IN - Cytology - PAP  3. Antepartum multigravida of advanced maternal age  3. H/O abnormal cervical Papanicolaou smear S/p cryotherapy Pap completed today  5. Cervical polyp Noted on exam Will plan for removal post partum   Preterm labor symptoms and general obstetric precautions including but not limited to vaginal bleeding, contractions, leaking of fluid and fetal movement were reviewed in detail with the patient.  Please refer to After Visit Summary for other counseling recommendations.   Return in about 1 month (around 04/22/2018) for OB visit.  Conan Bowens 03/25/2018 12:03 PM

## 2018-03-25 NOTE — Progress Notes (Signed)
Charity application given to patient. 

## 2018-03-26 LAB — CYTOLOGY - PAP
Chlamydia: NEGATIVE
DIAGNOSIS: NEGATIVE
HPV (WINDOPATH): NOT DETECTED
Neisseria Gonorrhea: NEGATIVE

## 2018-03-27 ENCOUNTER — Encounter: Payer: Self-pay | Admitting: *Deleted

## 2018-03-28 LAB — URINE CULTURE, OB REFLEX

## 2018-03-28 LAB — CULTURE, OB URINE

## 2018-04-01 LAB — OBSTETRIC PANEL, INCLUDING HIV
Antibody Screen: NEGATIVE
Basophils Absolute: 0 10*3/uL (ref 0.0–0.2)
Basos: 0 %
EOS (ABSOLUTE): 0 10*3/uL (ref 0.0–0.4)
EOS: 1 %
HEMATOCRIT: 38.4 % (ref 34.0–46.6)
HEP B S AG: NEGATIVE
HIV Screen 4th Generation wRfx: NONREACTIVE
Hemoglobin: 12.8 g/dL (ref 11.1–15.9)
Immature Grans (Abs): 0 10*3/uL (ref 0.0–0.1)
Immature Granulocytes: 0 %
Lymphocytes Absolute: 1.5 10*3/uL (ref 0.7–3.1)
Lymphs: 27 %
MCH: 30.5 pg (ref 26.6–33.0)
MCHC: 33.3 g/dL (ref 31.5–35.7)
MCV: 91 fL (ref 79–97)
MONOCYTES: 8 %
Monocytes Absolute: 0.4 10*3/uL (ref 0.1–0.9)
NEUTROS ABS: 3.7 10*3/uL (ref 1.4–7.0)
Neutrophils: 64 %
Platelets: 270 10*3/uL (ref 150–450)
RBC: 4.2 x10E6/uL (ref 3.77–5.28)
RDW: 13.2 % (ref 12.3–15.4)
RH TYPE: POSITIVE
RPR Ser Ql: NONREACTIVE
RUBELLA: 5.31 {index} (ref >0.99)
WBC: 5.6 10*3/uL (ref 3.4–10.8)

## 2018-04-01 LAB — SMN1 COPY NUMBER ANALYSIS (SMA CARRIER SCREENING)

## 2018-04-03 ENCOUNTER — Encounter: Payer: Self-pay | Admitting: *Deleted

## 2018-04-24 ENCOUNTER — Encounter: Payer: Self-pay | Admitting: Family Medicine

## 2018-05-06 ENCOUNTER — Encounter (HOSPITAL_COMMUNITY): Payer: Self-pay

## 2018-05-13 ENCOUNTER — Encounter: Payer: Self-pay | Admitting: Obstetrics and Gynecology

## 2018-05-13 ENCOUNTER — Ambulatory Visit (HOSPITAL_COMMUNITY)
Admission: RE | Admit: 2018-05-13 | Discharge: 2018-05-13 | Disposition: A | Discharge status: Home / Self Care | Payer: Self-pay | Source: Ambulatory Visit | Attending: Obstetrics and Gynecology | Admitting: Obstetrics and Gynecology

## 2018-05-13 ENCOUNTER — Encounter (HOSPITAL_COMMUNITY): Payer: Self-pay

## 2018-05-13 DIAGNOSIS — O09523 Supervision of elderly multigravida, third trimester: Secondary | ICD-10-CM

## 2018-05-13 DIAGNOSIS — Z3A19 19 weeks gestation of pregnancy: Secondary | ICD-10-CM | POA: Insufficient documentation

## 2018-05-13 DIAGNOSIS — O0990 Supervision of high risk pregnancy, unspecified, unspecified trimester: Secondary | ICD-10-CM

## 2018-05-13 DIAGNOSIS — Z363 Encounter for antenatal screening for malformations: Secondary | ICD-10-CM

## 2018-05-13 DIAGNOSIS — O09292 Supervision of pregnancy with other poor reproductive or obstetric history, second trimester: Secondary | ICD-10-CM | POA: Insufficient documentation

## 2018-05-13 DIAGNOSIS — Z8759 Personal history of other complications of pregnancy, childbirth and the puerperium: Secondary | ICD-10-CM | POA: Insufficient documentation

## 2018-05-14 ENCOUNTER — Other Ambulatory Visit (HOSPITAL_COMMUNITY): Payer: Self-pay | Admitting: *Deleted

## 2018-05-14 DIAGNOSIS — O09292 Supervision of pregnancy with other poor reproductive or obstetric history, second trimester: Secondary | ICD-10-CM

## 2018-05-28 ENCOUNTER — Ambulatory Visit (INDEPENDENT_AMBULATORY_CARE_PROVIDER_SITE_OTHER): Payer: Self-pay | Admitting: Obstetrics & Gynecology

## 2018-05-28 VITALS — BP 121/69 | HR 88 | Wt 132.0 lb

## 2018-05-28 DIAGNOSIS — O09522 Supervision of elderly multigravida, second trimester: Secondary | ICD-10-CM

## 2018-05-28 DIAGNOSIS — O0990 Supervision of high risk pregnancy, unspecified, unspecified trimester: Secondary | ICD-10-CM

## 2018-05-28 DIAGNOSIS — Z8759 Personal history of other complications of pregnancy, childbirth and the puerperium: Secondary | ICD-10-CM

## 2018-05-28 NOTE — Progress Notes (Addendum)
   PRENATAL VISIT NOTE  Subjective:  [name] is a 37 y.o. [id number] at 6747w2d being seen today for ongoing prenatal care.  She is currently monitored for the following issues for this high-risk pregnancy and has History of IUFD; Supervision of high risk pregnancy, antepartum; AMA (advanced maternal age) multigravida 35+; H/O abnormal cervical Papanicolaou smear; and Cervical polyp on their problem list.  Patient reports no complaints. She did have some old brown blood last weekend and no recent intercourse.   Contractions: Not present. Vag. Bleeding: None, Scant.  Movement: Present. Denies leaking of fluid.   The following portions of the patient's history were reviewed and updated as appropriate: allergies, current medications, past family history, past medical history, past social history, past surgical history and problem list. Problem list updated.  Objective:   Vitals:   05/28/18 1119  BP: 121/69  Pulse: 88  Weight: 132 lb (59.9 kg)    Fetal Status: Fetal Heart Rate (bpm): 149   Movement: Present     General:  Alert, oriented and cooperative. Patient is in no acute distress.  Skin: Skin is warm and dry. No rash noted.   Cardiovascular: Normal heart rate noted  Respiratory: Normal respiratory effort, no problems with respiration noted  Abdomen: Soft, gravid, appropriate for gestational age.  Pain/Pressure: Present     Pelvic: Cervical exam deferred        Extremities: Normal range of motion.     Mental Status: Normal mood and affect. Normal behavior. Normal judgment and thought content.  Cervix- small polyp seen and left intact  Assessment and Plan:  Pregnancy: A5W0981G4P2102 at 7147w2d  1. Multigravida of advanced maternal age in second trimester   2. Supervision of high risk pregnancy, antepartum   3. History of IUFD - q 4 week growth u/s - weekly BPP after 34 weeks  Preterm labor symptoms and general obstetric precautions including but not limited to vaginal  bleeding, contractions, leaking of fluid and fetal movement were reviewed in detail with the patient. Please refer to After Visit Summary for other counseling recommendations.  Return in about 4 weeks (around 06/25/2018).  Future Appointments  Date Time Provider Department Center  06/10/2018 11:15 AM [facility] US 4 [facility]  08/12/2018 11:15 AM [facility] US 4 [facility]    Allie BossierMyra C Gaspare Netzel, MD

## 2018-06-10 ENCOUNTER — Encounter (HOSPITAL_COMMUNITY): Payer: Self-pay

## 2018-06-10 ENCOUNTER — Ambulatory Visit (HOSPITAL_COMMUNITY)
Admission: RE | Admit: 2018-06-10 | Discharge: 2018-06-10 | Disposition: A | Discharge status: Home / Self Care | Payer: Self-pay | Source: Ambulatory Visit | Attending: Obstetrics and Gynecology | Admitting: Obstetrics and Gynecology

## 2018-06-25 ENCOUNTER — Encounter: Payer: Self-pay | Admitting: Obstetrics & Gynecology

## 2018-07-01 ENCOUNTER — Ambulatory Visit (HOSPITAL_COMMUNITY): Admission: RE | Admit: 2018-07-01 | Payer: Self-pay | Source: Ambulatory Visit

## 2018-08-12 ENCOUNTER — Ambulatory Visit (HOSPITAL_COMMUNITY)
Admission: RE | Admit: 2018-08-12 | Discharge: 2018-08-12 | Disposition: A | Discharge status: Home / Self Care | Payer: Self-pay | Source: Ambulatory Visit | Attending: Obstetrics and Gynecology | Admitting: Obstetrics and Gynecology

## 2018-08-12 ENCOUNTER — Encounter (HOSPITAL_COMMUNITY): Payer: Self-pay

## 2018-08-12 DIAGNOSIS — O09292 Supervision of pregnancy with other poor reproductive or obstetric history, second trimester: Secondary | ICD-10-CM

## 2018-08-12 DIAGNOSIS — O09523 Supervision of elderly multigravida, third trimester: Secondary | ICD-10-CM

## 2018-08-12 DIAGNOSIS — Z3A32 32 weeks gestation of pregnancy: Secondary | ICD-10-CM | POA: Insufficient documentation

## 2018-08-12 DIAGNOSIS — Z363 Encounter for antenatal screening for malformations: Secondary | ICD-10-CM

## 2018-08-17 ENCOUNTER — Other Ambulatory Visit (HOSPITAL_COMMUNITY): Payer: Self-pay | Admitting: *Deleted

## 2018-08-17 DIAGNOSIS — Z8759 Personal history of other complications of pregnancy, childbirth and the puerperium: Secondary | ICD-10-CM

## 2018-08-26 ENCOUNTER — Ambulatory Visit (HOSPITAL_COMMUNITY): Admission: RE | Admit: 2018-08-26 | Payer: Self-pay | Source: Ambulatory Visit

## 2018-09-02 ENCOUNTER — Ambulatory Visit (HOSPITAL_COMMUNITY)
Admission: RE | Admit: 2018-09-02 | Discharge: 2018-09-02 | Disposition: A | Discharge status: Home / Self Care | Payer: Self-pay | Source: Ambulatory Visit | Attending: Obstetrics and Gynecology | Admitting: Obstetrics and Gynecology

## 2018-09-02 ENCOUNTER — Encounter (HOSPITAL_COMMUNITY): Payer: Self-pay

## 2018-09-02 DIAGNOSIS — Z3A35 35 weeks gestation of pregnancy: Secondary | ICD-10-CM | POA: Insufficient documentation

## 2018-09-02 DIAGNOSIS — O09293 Supervision of pregnancy with other poor reproductive or obstetric history, third trimester: Secondary | ICD-10-CM | POA: Insufficient documentation

## 2018-09-02 DIAGNOSIS — O09523 Supervision of elderly multigravida, third trimester: Secondary | ICD-10-CM | POA: Insufficient documentation

## 2018-09-02 DIAGNOSIS — Z8759 Personal history of other complications of pregnancy, childbirth and the puerperium: Secondary | ICD-10-CM

## 2018-09-08 ENCOUNTER — Ambulatory Visit (HOSPITAL_COMMUNITY): Admission: RE | Admit: 2018-09-08 | Payer: Self-pay | Source: Ambulatory Visit

## 2018-09-11 ENCOUNTER — Encounter: Payer: Self-pay | Admitting: Obstetrics and Gynecology

## 2018-09-11 ENCOUNTER — Ambulatory Visit: Payer: Self-pay

## 2018-09-11 ENCOUNTER — Ambulatory Visit (INDEPENDENT_AMBULATORY_CARE_PROVIDER_SITE_OTHER): Payer: Self-pay | Admitting: Obstetrics and Gynecology

## 2018-09-11 VITALS — BP 101/72 | HR 78 | Wt 138.5 lb

## 2018-09-11 DIAGNOSIS — Z8759 Personal history of other complications of pregnancy, childbirth and the puerperium: Secondary | ICD-10-CM

## 2018-09-11 DIAGNOSIS — Z113 Encounter for screening for infections with a predominantly sexual mode of transmission: Secondary | ICD-10-CM

## 2018-09-11 DIAGNOSIS — O09529 Supervision of elderly multigravida, unspecified trimester: Secondary | ICD-10-CM

## 2018-09-11 DIAGNOSIS — O0990 Supervision of high risk pregnancy, unspecified, unspecified trimester: Secondary | ICD-10-CM

## 2018-09-11 NOTE — Progress Notes (Signed)
   PRENATAL VISIT NOTE  Subjective:  [name] is a 37 y.o. A2Z3086G4P2102 at 4229w3d being seen today for ongoing prenatal care.  She is currently monitored for the following issues for this high-risk pregnancy and has History of IUFD; Supervision of high risk pregnancy, antepartum; AMA (advanced maternal age) multigravida 35+; H/O abnormal cervical Papanicolaou smear; and Cervical polyp on their problem list.  Patient reports occasional contractions.  Contractions: Irregular. Vag. Bleeding: None.  Movement: Present. Denies leaking of fluid.   The following portions of the patient's history were reviewed and updated as appropriate: allergies, current medications, past family history, past medical history, past social history, past surgical history and problem list. Problem list updated.  Objective:   Vitals:   09/11/18 1126  BP: 101/72  Pulse: 78  Weight: 138 lb 8 oz (62.8 kg)    Fetal Status:     Movement: Present     General:  Alert, oriented and cooperative. Patient is in no acute distress.  Skin: Skin is warm and dry. No rash noted.   Cardiovascular: Normal heart rate noted  Respiratory: Normal respiratory effort, no problems with respiration noted  Abdomen: Soft, gravid, appropriate for gestational age.  Pain/Pressure: Present     Pelvic: Cervical exam deferred        Extremities: Normal range of motion.     Mental Status: Normal mood and affect. Normal behavior. Normal judgment and thought content.   Assessment and Plan:  Pregnancy: [id number] at 6429w3d  1. Supervision of high risk pregnancy, antepartum - GC/Chlamydia probe amp ()not at [facility] - Strep Gp B NAA Has not been seen since 21 weeks, 3rd trim labs ordered today Will check CBG next week and A1C today  2. History of IUFD Needs to start weekly testing BPP/NST today Will plan for IOL 39 weeks  3. Antepartum multigravida of advanced maternal age   Preterm labor symptoms and general obstetric  precautions including but not limited to vaginal bleeding, contractions, leaking of fluid and fetal movement were reviewed in detail with the patient. Please refer to After Visit Summary for other counseling recommendations.  Return in about 1 week (around 09/18/2018) for NST/BPP and HOB.  Future Appointments  Date Time Provider Department Center  09/15/2018  1:30 PM [facility] US 1 [facility]-US    Conan BowensKelly M Davis, MD

## 2018-09-11 NOTE — Progress Notes (Signed)
Pt states she has been having intermittent soreness of both arms and very fatigued.

## 2018-09-12 LAB — CBC
Hematocrit: 33.7 % — ABNORMAL LOW (ref 34.0–46.6)
Hemoglobin: 11.3 g/dL (ref 11.1–15.9)
MCH: 30 pg (ref 26.6–33.0)
MCHC: 33.5 g/dL (ref 31.5–35.7)
MCV: 89 fL (ref 79–97)
Platelets: 274 10*3/uL (ref 150–450)
RBC: 3.77 x10E6/uL (ref 3.77–5.28)
RDW: 12.3 % (ref 12.3–15.4)
WBC: 6.5 10*3/uL (ref 3.4–10.8)

## 2018-09-12 LAB — HEMOGLOBIN A1C
Est. average glucose Bld gHb Est-mCnc: 103 mg/dL
Hgb A1c MFr Bld: 5.2 % (ref 4.8–5.6)

## 2018-09-12 LAB — HIV ANTIBODY (ROUTINE TESTING W REFLEX): HIV Screen 4th Generation wRfx: NONREACTIVE

## 2018-09-12 LAB — RPR: RPR Ser Ql: NONREACTIVE

## 2018-09-13 LAB — STREP GP B NAA: Strep Gp B NAA: NEGATIVE

## 2018-09-14 LAB — GC/CHLAMYDIA PROBE AMP (~~LOC~~) NOT AT ARMC
Chlamydia: NEGATIVE
Neisseria Gonorrhea: NEGATIVE

## 2018-09-15 ENCOUNTER — Encounter (HOSPITAL_COMMUNITY): Payer: Self-pay

## 2018-09-15 ENCOUNTER — Ambulatory Visit (HOSPITAL_COMMUNITY): Admission: RE | Admit: 2018-09-15 | Payer: Self-pay | Source: Ambulatory Visit

## 2018-09-16 NOTE — L&D Delivery Note (Addendum)
Patient: [name] MRN: [id number]  GBS status: neg   Patient is a 38 y.o. now [id number] s/p NSVD at [redacted]w[redacted]d, who was admitted for SOL. AROM 1h 74m prior to delivery with light mec fluid.    Delivery Note At 2:07 AM a viable female was delivered via Vaginal, Spontaneous (Presentation:LOA ;  ).  APGAR: 6, 9; weight  Pending .   Placenta status: intact, spontaneous , .  Cord: 3 vessel  Cord pH: 7.17 arterial   Anesthesia:  10 cc Lidocaine for repair Episiotomy: None Lacerations:  1st degree periurethral Suture Repair: 2.0 vicryl ct  Est. Blood Loss (mL): 437  Called to bedside with patient reporting the need to push. FHT's noted to be in 60s with mom actively pushing and fetal descent noted. Head delivered LOA with left compound presentation. No nuchal cord present. Shoulder and body delivered in usual fashion. Infant with spontaneous cry, placed on mother's abdomen, dried and bulb suctioned. Cord clamped x 2 after 1-minute delay, and cut by family member. Cord blood drawn. Placenta delivered spontaneously with gentle cord traction. Fundus firm with massage and Pitocin. Perineum inspected and found to have periurethral laceration, which was repaired with a 3-0 vicryl ct with good hemostasis achieved.   Mom to postpartum.  Baby to Couplet care / Skin to Skin.  Sigurd Sos Deloglos 09/25/2018, 2:38 AM  OB FELLOW DELIVERY ATTESTATION  I was gloved and present for the delivery in its entirety, and I agree with the above resident's note.    Marcy Siren, D.O. OB Fellow  09/25/2018, 3:46 AM

## 2018-09-20 ENCOUNTER — Inpatient Hospital Stay (HOSPITAL_COMMUNITY)
Admission: AD | Admit: 2018-09-20 | Discharge: 2018-09-20 | Disposition: A | Discharge status: Home / Self Care | Payer: Self-pay | Source: Ambulatory Visit | Attending: Obstetrics and Gynecology | Admitting: Obstetrics and Gynecology

## 2018-09-20 ENCOUNTER — Other Ambulatory Visit: Payer: Self-pay

## 2018-09-20 ENCOUNTER — Encounter (HOSPITAL_COMMUNITY): Payer: Self-pay

## 2018-09-20 DIAGNOSIS — Z3689 Encounter for other specified antenatal screening: Secondary | ICD-10-CM

## 2018-09-20 DIAGNOSIS — J111 Influenza due to unidentified influenza virus with other respiratory manifestations: Secondary | ICD-10-CM | POA: Insufficient documentation

## 2018-09-20 DIAGNOSIS — R69 Illness, unspecified: Secondary | ICD-10-CM

## 2018-09-20 DIAGNOSIS — Z3A37 37 weeks gestation of pregnancy: Secondary | ICD-10-CM | POA: Insufficient documentation

## 2018-09-20 DIAGNOSIS — O99513 Diseases of the respiratory system complicating pregnancy, third trimester: Secondary | ICD-10-CM | POA: Insufficient documentation

## 2018-09-20 LAB — URINALYSIS, ROUTINE W REFLEX MICROSCOPIC
BACTERIA UA: NONE SEEN
BILIRUBIN URINE: NEGATIVE
Glucose, UA: NEGATIVE mg/dL
Hgb urine dipstick: NEGATIVE
Ketones, ur: 80 mg/dL — AB
LEUKOCYTES UA: NEGATIVE
Nitrite: NEGATIVE
Protein, ur: 30 mg/dL — AB
SPECIFIC GRAVITY, URINE: 1.027 (ref 1.005–1.030)
pH: 5 (ref 5.0–8.0)

## 2018-09-20 MED ORDER — OSELTAMIVIR PHOSPHATE 75 MG PO CAPS: 75.0000 mg | ORAL_CAPSULE | Freq: Two times a day (BID) | ORAL | 0 refills | Status: DC

## 2018-09-20 NOTE — MAU Note (Signed)
Pt refused flu and strep throat swabs.

## 2018-09-20 NOTE — Discharge Instructions (Signed)
Viral Respiratory Infection  A respiratory infection is an illness that affects part of the respiratory system, such as the lungs, nose, or throat. A respiratory infection that is caused by a virus is called a viral respiratory infection.  Common types of viral respiratory infections include:  · A cold.  · The flu (influenza).  · A respiratory syncytial virus (RSV) infection.  What are the causes?  This condition is caused by a virus.  What are the signs or symptoms?  Symptoms of this condition include:  · A stuffy or runny nose.  · Yellow or green nasal discharge.  · A cough.  · Sneezing.  · Fatigue.  · Achy muscles.  · A sore throat.  · Sweating or chills.  · A fever.  · A headache.  How is this diagnosed?  This condition may be diagnosed based on:  · Your symptoms.  · A physical exam.  · Testing of nasal swabs.  How is this treated?  This condition may be treated with medicines, such as:  · Antiviral medicine. This may shorten the length of time a person has symptoms.  · Expectorants. These make it easier to cough up mucus.  · Decongestant nasal sprays.  · Acetaminophen or NSAIDs to relieve fever and pain.  Antibiotic medicines are not prescribed for viral infections. This is because antibiotics are designed to kill bacteria. They are not effective against viruses.  Follow these instructions at home:    Managing pain and congestion  · Take over-the-counter and prescription medicines only as told by your health care provider.  · If you have a sore throat, gargle with a salt-water mixture 3-4 times a day or as needed. To make a salt-water mixture, completely dissolve ½-1 tsp of salt in 1 cup of warm water.  · Use nose drops made from salt water to ease congestion and soften raw skin around your nose.  · Drink enough fluid to keep your urine pale yellow. This helps prevent dehydration and helps loosen up mucus.  General instructions  · Rest as much as possible.  · Do not drink alcohol.  · Do not use any products  that contain nicotine or tobacco, such as cigarettes and e-cigarettes. If you need help quitting, ask your health care provider.  · Keep all follow-up visits as told by your health care provider. This is important.  How is this prevented?    · Get an annual flu shot. You may get the flu shot in late summer, fall, or winter. Ask your health care provider when you should get your flu shot.  · Avoid exposing others to your respiratory infection.  ? Stay home from work or school as told by your health care provider.  ? Wash your hands with soap and water often, especially after you cough or sneeze. If soap and water are not available, use alcohol-based hand sanitizer.  · Avoid contact with people who are sick during cold and flu season. This is generally fall and winter.  Contact a health care provider if:  · Your symptoms last for 10 days or longer.  · Your symptoms get worse over time.  · You have a fever.  · You have severe sinus pain in your face or forehead.  · The glands in your jaw or neck become very swollen.  Get help right away if you:  · Feel pain or pressure in your chest.  · Have shortness of breath.  · Faint or feel like   you will faint.  · Have severe and persistent vomiting.  · Feel confused or disoriented.  Summary  · A respiratory infection is an illness that affects part of the respiratory system, such as the lungs, nose, or throat. A respiratory infection that is caused by a virus is called a viral respiratory infection.  · Common types of viral respiratory infections are a cold, influenza, and respiratory syncytial virus (RSV) infection.  · Symptoms of this condition include a stuffy or runny nose, cough, sneezing, fatigue, achy muscles, sore throat, and fevers or chills.  · Antibiotic medicines are not prescribed for viral infections. This is because antibiotics are designed to kill bacteria. They are not effective against viruses.  This information is not intended to replace advice given to you by  your health care provider. Make sure you discuss any questions you have with your health care provider.  Document Released: 06/12/2005 Document Revised: 10/13/2017 Document Reviewed: 10/13/2017  Elsevier Interactive Patient Education © 2019 [employer]

## 2018-09-20 NOTE — MAU Provider Note (Signed)
History     CSN: [id number]  Arrival date and time: 09/20/18 1736   Chief Complaint  Patient presents with  . Pelvic Pain  . Decreased Fetal Movement  . Cough  . Sore Throat   G4P2102 @37 .5 wks presenting with cough, sore throat, and body aches. Sx started yesterday. Felt feverish today but didn't check temp. She took 1 pill of Amoxicillin that she had at home. Her spouse and kids have same sx. Reports good FM. Denies VB, LOF, and ctx but reports increased pelvic pressure today.   OB History    Gravida  4   Para  3   Term  2   Preterm  1   AB  0   Living  2     SAB  0   TAB  0   Ectopic  0   Multiple  0   Live Births  2           Past Medical History:  Diagnosis Date  . Oligohydramnios   . Preterm labor     Past Surgical History:  Procedure Laterality Date  . NO PAST SURGERIES      Family History  Problem Relation Age of Onset  . Cancer Mother   . Alcohol abuse Brother   . Asthma Brother   . Birth defects Brother   . Alcohol abuse Paternal Aunt   . Miscarriages / Stillbirths Maternal Grandmother   . Heart disease Maternal Grandfather     Social History   Tobacco Use  . Smoking status: Never Smoker  . Smokeless tobacco: Never Used  Substance Use Topics  . Alcohol use: No  . Drug use: No    Allergies: No Known Allergies  Medications Prior to Admission  Medication Sig Dispense Refill Last Dose  . Prenatal Vit-Fe Fumarate-FA (PRENATAL VITAMIN PO) Take by mouth.   Taking    Review of Systems  Constitutional: Positive for fever. Negative for chills.  Respiratory: Positive for cough. Negative for shortness of breath.   Gastrointestinal: Negative for abdominal pain.  Genitourinary: Positive for pelvic pain. Negative for vaginal bleeding and vaginal discharge.  Musculoskeletal: Positive for myalgias.   Physical Exam   Blood pressure 114/89, pulse (!) 111, temperature 99.4 F (37.4 C), temperature source Oral, resp. rate 18, height  5\' 2"  (1.575 m), weight 63.6 kg, last menstrual period 01/07/2018, unknown if currently breastfeeding.  Physical Exam  Nursing note and vitals reviewed. Constitutional: She is oriented to person, place, and time. She appears well-developed and well-nourished. No distress.  HENT:  Head: Normocephalic and atraumatic.  Right Ear: Hearing, tympanic membrane, external ear and ear canal normal.  Left Ear: Hearing, tympanic membrane, external ear and ear canal normal.  Nose: Nose normal.  Mouth/Throat: Uvula is midline, oropharynx is clear and moist and mucous membranes are normal.  Neck: Normal range of motion.  Cardiovascular: Normal rate, regular rhythm and normal heart sounds.  Respiratory: Effort normal and breath sounds normal. No respiratory distress. She has no wheezes. She has no rales.  GI: Soft. She exhibits no distension. There is no abdominal tenderness.  gravid  Genitourinary:    Genitourinary Comments: VE 1/70/-2, vtx   Musculoskeletal: Normal range of motion.  Neurological: She is oriented to person, place, and time.  Skin: Skin is warm and dry.  Psychiatric: She has a normal mood and affect.  EFM: 150 bpm, mod variability, + accels, no decels Toco: rare  Results for orders placed or performed during the hospital encounter  of 09/20/18 (from the past 24 hour(s))  Urinalysis, Routine w reflex microscopic     Status: Abnormal   Collection Time: 09/20/18  6:04 PM  Result Value Ref Range   Color, Urine AMBER (A) YELLOW   APPearance HAZY (A) CLEAR   Specific Gravity, Urine 1.027 1.005 - 1.030   pH 5.0 5.0 - 8.0   Glucose, UA NEGATIVE NEGATIVE mg/dL   Hgb urine dipstick NEGATIVE NEGATIVE   Bilirubin Urine NEGATIVE NEGATIVE   Ketones, ur 80 (A) NEGATIVE mg/dL   Protein, ur 30 (A) NEGATIVE mg/dL   Nitrite NEGATIVE NEGATIVE   Leukocytes, UA NEGATIVE NEGATIVE   RBC / HPF 0-5 0 - 5 RBC/hpf   WBC, UA 0-5 0 - 5 WBC/hpf   Bacteria, UA NONE SEEN NONE SEEN   Squamous Epithelial /  LPF 11-20 0 - 5   Mucus PRESENT    MAU Course  Procedures Orders Placed This Encounter  Procedures  . Group A Strep by PCR    Standing Status:   Standing    Number of Occurrences:   1  . Urinalysis, Routine w reflex microscopic    Standing Status:   Standing    Number of Occurrences:   1  . Influenza panel by PCR (type A & B)    Standing Status:   Standing    Number of Occurrences:   1  . Droplet precaution    Standing Status:   Standing    Number of Occurrences:   1   MDM Labs ordered. Pt refused swab collection. Will treat for presumed Influenza. Stable for discharge home.   Assessment and Plan   1. [redacted] weeks gestation of pregnancy   2. NST (non-stress test) reactive   3. Influenza-like illness    Discharge home Follow up in [facility] as scheduled Labor precautions Rx Tamiflu Hydrate  Rest  Allergies as of 09/20/2018   No Known Allergies     Medication List    TAKE these medications   oseltamivir 75 MG capsule Commonly known as:  TAMIFLU Take 1 capsule (75 mg total) by mouth 2 (two) times daily for 5 days.   PRENATAL VITAMIN PO Take by mouth.      Donette LarryMelanie Willia Lampert, CNM 09/20/2018, 6:35 PM

## 2018-09-20 NOTE — MAU Note (Signed)
Feeling a lot of pelvic pressure and in her back and some cramping  Cough, sore throat, body aches  No bleeding, no LOF  Decreased fetal movement, has felt none today

## 2018-09-21 ENCOUNTER — Other Ambulatory Visit: Payer: Self-pay

## 2018-09-21 ENCOUNTER — Encounter: Payer: Self-pay | Admitting: Obstetrics & Gynecology

## 2018-09-23 ENCOUNTER — Ambulatory Visit (INDEPENDENT_AMBULATORY_CARE_PROVIDER_SITE_OTHER): Payer: Self-pay | Admitting: *Deleted

## 2018-09-23 ENCOUNTER — Ambulatory Visit: Payer: Self-pay

## 2018-09-23 ENCOUNTER — Encounter: Payer: Self-pay | Admitting: Obstetrics and Gynecology

## 2018-09-23 ENCOUNTER — Ambulatory Visit (INDEPENDENT_AMBULATORY_CARE_PROVIDER_SITE_OTHER): Payer: Self-pay | Admitting: Obstetrics & Gynecology

## 2018-09-23 VITALS — BP 99/63 | HR 80 | Temp 97.8°F | Wt 136.1 lb

## 2018-09-23 DIAGNOSIS — Z8759 Personal history of other complications of pregnancy, childbirth and the puerperium: Secondary | ICD-10-CM

## 2018-09-23 DIAGNOSIS — Z3A38 38 weeks gestation of pregnancy: Secondary | ICD-10-CM

## 2018-09-23 DIAGNOSIS — O09523 Supervision of elderly multigravida, third trimester: Secondary | ICD-10-CM

## 2018-09-23 DIAGNOSIS — O0990 Supervision of high risk pregnancy, unspecified, unspecified trimester: Secondary | ICD-10-CM

## 2018-09-23 DIAGNOSIS — O0993 Supervision of high risk pregnancy, unspecified, third trimester: Secondary | ICD-10-CM

## 2018-09-23 NOTE — Progress Notes (Signed)

## 2018-09-23 NOTE — Progress Notes (Signed)
   PRENATAL VISIT NOTE  Subjective:  [name] is a 38 y.o. [id number] at [redacted]w[redacted]d being seen today for ongoing prenatal care.  She is currently monitored for the following issues for this high-risk pregnancy and has History of IUFD; Supervision of high risk pregnancy, antepartum; AMA (advanced maternal age) multigravida 35+; H/O abnormal cervical Papanicolaou smear; and Cervical polyp on their problem list.  Patient reports no complaints.  Contractions: Irregular. Vag. Bleeding: Scant.  Movement: (!) Decreased. Denies leaking of fluid.   The following portions of the patient's history were reviewed and updated as appropriate: allergies, current medications, past family history, past medical history, past social history, past surgical history and problem list. Problem list updated.  Objective:   Vitals:   09/23/18 1334  BP: 99/63  Pulse: 80  Temp: 97.8 F (36.6 C)  Weight: 136 lb 1.6 oz (61.7 kg)    Fetal Status:     Movement: (!) Decreased     General:  Alert, oriented and cooperative. Patient is in no acute distress.  Skin: Skin is warm and dry. No rash noted.   Cardiovascular: Normal heart rate noted  Respiratory: Normal respiratory effort, no problems with respiration noted  Abdomen: Soft, gravid, appropriate for gestational age.  Pain/Pressure: Present     Pelvic: Cervical exam performed        Extremities: Normal range of motion.     Mental Status: Normal mood and affect. Normal behavior. Normal judgment and thought content.   Assessment and Plan:  Pregnancy: [id number] at [redacted]w[redacted]d  1. Multigravida of advanced maternal age in third trimester   2. History of IUFD -weekly BPP and IOL at 39 weeks  3. Supervision of high risk pregnancy, antepartum   Term labor symptoms and general obstetric precautions including but not limited to vaginal bleeding, contractions, leaking of fluid and fetal movement were reviewed in detail with the patient. Please refer to After Visit  Summary for other counseling recommendations.  Return in about 5 weeks (around 10/28/2018) for IOL on 1/14.  Future Appointments  Date Time Provider Department Center  09/29/2018  7:30 AM [facility] ROOM [facility] None    Allie Bossier, MD

## 2018-09-23 NOTE — Progress Notes (Signed)
Pt states she was seen @ MAU on 1/5 due to pelvic pressure and pain. Per chart review, pt was given Rx for Tamiflu however flu test was not performed. She also reports decreased fetal movement since yesterday and having a headache today. IOL scheduled 1/14 @ 0730.

## 2018-09-24 ENCOUNTER — Other Ambulatory Visit: Payer: Self-pay

## 2018-09-24 ENCOUNTER — Inpatient Hospital Stay (HOSPITAL_COMMUNITY)
Admission: AD | Admit: 2018-09-24 | Discharge: 2018-09-26 | DRG: 807 | Disposition: A | Discharge status: Home / Self Care | Payer: Medicaid Other | Attending: Obstetrics and Gynecology | Admitting: Obstetrics and Gynecology

## 2018-09-24 ENCOUNTER — Encounter (HOSPITAL_COMMUNITY): Payer: Self-pay

## 2018-09-24 ENCOUNTER — Telehealth (HOSPITAL_COMMUNITY): Payer: Self-pay | Admitting: *Deleted

## 2018-09-24 ENCOUNTER — Inpatient Hospital Stay (HOSPITAL_COMMUNITY)
Admission: AD | Admit: 2018-09-24 | Discharge: 2018-09-24 | Disposition: A | Discharge status: Home / Self Care | Payer: Medicaid Other | Attending: Obstetrics and Gynecology | Admitting: Obstetrics and Gynecology

## 2018-09-24 DIAGNOSIS — Z3A38 38 weeks gestation of pregnancy: Secondary | ICD-10-CM | POA: Diagnosis not present

## 2018-09-24 DIAGNOSIS — Z8759 Personal history of other complications of pregnancy, childbirth and the puerperium: Secondary | ICD-10-CM

## 2018-09-24 DIAGNOSIS — Z3483 Encounter for supervision of other normal pregnancy, third trimester: Secondary | ICD-10-CM | POA: Diagnosis present

## 2018-09-24 DIAGNOSIS — O479 False labor, unspecified: Secondary | ICD-10-CM

## 2018-09-24 DIAGNOSIS — O326XX0 Maternal care for compound presentation, not applicable or unspecified: Secondary | ICD-10-CM | POA: Diagnosis present

## 2018-09-24 DIAGNOSIS — Z8742 Personal history of other diseases of the female genital tract: Secondary | ICD-10-CM

## 2018-09-24 DIAGNOSIS — O0990 Supervision of high risk pregnancy, unspecified, unspecified trimester: Secondary | ICD-10-CM

## 2018-09-24 DIAGNOSIS — O471 False labor at or after 37 completed weeks of gestation: Secondary | ICD-10-CM | POA: Diagnosis present

## 2018-09-24 DIAGNOSIS — Z23 Encounter for immunization: Secondary | ICD-10-CM

## 2018-09-24 DIAGNOSIS — O09529 Supervision of elderly multigravida, unspecified trimester: Secondary | ICD-10-CM

## 2018-09-24 LAB — TYPE AND SCREEN
ABO/RH(D): O POS
Antibody Screen: NEGATIVE

## 2018-09-24 LAB — CBC
HCT: 33.4 % — ABNORMAL LOW (ref 36.0–46.0)
Hemoglobin: 11.1 g/dL — ABNORMAL LOW (ref 12.0–15.0)
MCH: 30 pg (ref 26.0–34.0)
MCHC: 33.2 g/dL (ref 30.0–36.0)
MCV: 90.3 fL (ref 80.0–100.0)
Platelets: 222 10*3/uL (ref 150–400)
RBC: 3.7 MIL/uL — ABNORMAL LOW (ref 3.87–5.11)
RDW: 13.2 % (ref 11.5–15.5)
WBC: 6.2 10*3/uL (ref 4.0–10.5)
nRBC: 0 % (ref 0.0–0.2)

## 2018-09-24 MED ORDER — LACTATED RINGERS IV SOLN: 500.0000 mL | INTRAVENOUS | Status: DC | PRN

## 2018-09-24 MED ORDER — OXYTOCIN 40 UNITS IN NORMAL SALINE INFUSION - SIMPLE MED
INTRAVENOUS | Status: AC
  Filled 2018-09-24: qty 1000

## 2018-09-24 MED ORDER — TERBUTALINE SULFATE 1 MG/ML IJ SOLN
0.2500 mg | Freq: Once | INTRAMUSCULAR | Status: DC | PRN
  Filled 2018-09-24: qty 1

## 2018-09-24 MED ORDER — OXYTOCIN BOLUS FROM INFUSION: 500.0000 mL | Freq: Once | INTRAVENOUS | Status: DC

## 2018-09-24 MED ORDER — OXYCODONE-ACETAMINOPHEN 5-325 MG PO TABS: 1.0000 | ORAL_TABLET | ORAL | Status: DC | PRN

## 2018-09-24 MED ORDER — OXYTOCIN 40 UNITS IN NORMAL SALINE INFUSION - SIMPLE MED
2.5000 [IU]/h | INTRAVENOUS | Status: DC
  Administered 2018-09-25: 2.5 [IU]/h via INTRAVENOUS

## 2018-09-24 MED ORDER — OXYCODONE-ACETAMINOPHEN 5-325 MG PO TABS: 2.0000 | ORAL_TABLET | ORAL | Status: DC | PRN

## 2018-09-24 MED ORDER — LACTATED RINGERS IV SOLN
INTRAVENOUS | Status: DC
  Administered 2018-09-24 – 2018-09-25 (×2): via INTRAVENOUS

## 2018-09-24 MED ORDER — ONDANSETRON HCL 4 MG/2ML IJ SOLN: 4.0000 mg | Freq: Four times a day (QID) | INTRAMUSCULAR | Status: DC | PRN

## 2018-09-24 MED ORDER — FENTANYL CITRATE (PF) 100 MCG/2ML IJ SOLN
100.0000 ug | INTRAMUSCULAR | Status: DC | PRN
  Administered 2018-09-24 – 2018-09-25 (×3): 100 ug via INTRAVENOUS
  Filled 2018-09-24 (×2): qty 2

## 2018-09-24 MED ORDER — LIDOCAINE HCL (PF) 1 % IJ SOLN
INTRAMUSCULAR | Status: AC
  Filled 2018-09-24: qty 30

## 2018-09-24 MED ORDER — SOD CITRATE-CITRIC ACID 500-334 MG/5ML PO SOLN: 30.0000 mL | ORAL | Status: DC | PRN

## 2018-09-24 MED ORDER — FENTANYL CITRATE (PF) 100 MCG/2ML IJ SOLN
INTRAMUSCULAR | Status: AC
  Filled 2018-09-24: qty 2

## 2018-09-24 MED ORDER — OSELTAMIVIR PHOSPHATE 75 MG PO CAPS
75.0000 mg | ORAL_CAPSULE | Freq: Two times a day (BID) | ORAL | Status: DC
  Administered 2018-09-25: 75 mg via ORAL
  Filled 2018-09-24 (×2): qty 1

## 2018-09-24 MED ORDER — LIDOCAINE HCL (PF) 1 % IJ SOLN
30.0000 mL | INTRAMUSCULAR | Status: DC | PRN
  Filled 2018-09-24: qty 30

## 2018-09-24 MED ORDER — ACETAMINOPHEN 325 MG PO TABS: 650.0000 mg | ORAL_TABLET | ORAL | Status: DC | PRN

## 2018-09-24 NOTE — Plan of Care (Signed)
  Problem: Education: Goal: Knowledge of Childbirth will improve Outcome: Progressing Goal: Ability to make informed decisions regarding treatment and plan of care will improve Outcome: Progressing Goal: Ability to state and carry out methods to decrease the pain will improve Outcome: Progressing   Problem: Coping: Goal: Ability to verbalize concerns and feelings about labor and delivery will improve Outcome: Progressing   Problem: Life Cycle: Goal: Ability to make normal progression through stages of labor will improve Outcome: Progressing Goal: Ability to effectively push during vaginal delivery will improve Outcome: Progressing   Problem: Role Relationship: Goal: Ability to demonstrate positive interaction with the child will improve Outcome: Progressing   Problem: Safety: Goal: Risk of complications during labor and delivery will decrease Outcome: Progressing   Problem: Pain Management: Goal: Relief or control of pain from uterine contractions will improve Outcome: Progressing   Problem: Health Behavior/Discharge Planning: Goal: Ability to manage health-related needs will improve Outcome: Progressing   Problem: Clinical Measurements: Goal: Ability to maintain clinical measurements within normal limits will improve Outcome: Progressing Goal: Will remain free from infection Outcome: Progressing Goal: Diagnostic test results will improve Outcome: Progressing Goal: Respiratory complications will improve Outcome: Progressing Goal: Cardiovascular complication will be avoided Outcome: Progressing   Problem: Activity: Goal: Risk for activity intolerance will decrease Outcome: Progressing   Problem: Nutrition: Goal: Adequate nutrition will be maintained Outcome: Progressing   Problem: Elimination: Goal: Will not experience complications related to bowel motility Outcome: Progressing Goal: Will not experience complications related to urinary retention Outcome:  Progressing   Problem: Pain Managment: Goal: General experience of comfort will improve Outcome: Progressing   Problem: Skin Integrity: Goal: Risk for impaired skin integrity will decrease Outcome: Progressing   

## 2018-09-24 NOTE — H&P (Signed)
LABOR AND DELIVERY ADMISSION HISTORY AND PHYSICAL NOTE  [name] is a 38 y.o. female [phone] with IUP at 2867w2d presenting for induction of labor. She reports positive fetal movement. She denies leakage of fluid, vaginal bleeding, and calf tenderness. She states that contractions started at 2AM and became regular by 5AM (8-1810minutes apart). She does not plan on receiving an epidural. Husband is waiting outside due to caring for 40 year old child.   [facility] Pregnancy complications:  -Limited PNC, no visits between 9/12-12/27  -AMA  -h/o IUFD at 36 weeks with oligohydramnios  -cervical polyp   Past Medical History:     Past Medical History:  Diagnosis Date  . Oligohydramnios   . Preterm labor     Past Surgical History:      Past Surgical History:  Procedure Laterality Date  . NO PAST SURGERIES      Obstetrical History:         OB History    Gravida  4   Para  3   Term  2   Preterm  1   AB  0   Living  2     SAB  0   TAB  0   Ectopic  0   Multiple  0   Live Births  2           Social History: Social History        Socioeconomic History  . Marital status: Married    Spouse name: Not on file  . Number of children: Not on file  . Years of education: Not on file  . Highest education level: Not on file  Occupational History  . Not on file  Social Needs  . Financial resource strain: Not on file  . Food insecurity:    Worry: Not on file    Inability: Not on file  . Transportation needs:    Medical: Not on file    Non-medical: Not on file  Tobacco Use  . Smoking status: Never Smoker  . Smokeless tobacco: Never Used  Substance and Sexual Activity  . Alcohol use: No  . Drug use: No  . Sexual activity: Not Currently    Birth control/protection: None  Lifestyle  . Physical activity:    Days per week: Not on file    Minutes per  session: Not on file  . Stress: Not on file  Relationships  . Social connections:    Talks on phone: Not on file    Gets together: Not on file    Attends religious service: Not on file    Active member of club or organization: Not on file    Attends meetings of clubs or organizations: Not on file    Relationship status: Not on file  Other Topics Concern  . Not on file  Social History Narrative  . Not on file    Family History:      Family History  Problem Relation Age of Onset  . Cancer Mother   . Alcohol abuse Brother   . Asthma Brother   . Birth defects Brother   . Alcohol abuse Paternal Aunt   . Miscarriages / Stillbirths Maternal Grandmother   . Heart disease Maternal Grandfather     Allergies: No Known Allergies         Medications Prior to Admission  Medication Sig Dispense Refill Last Dose  . oseltamivir (TAMIFLU) 75 MG capsule Take 1 capsule (  75 mg total) by mouth 2 (two) times daily for 5 days. 10 capsule 0 Taking  . Prenatal Vit-Fe Fumarate-FA (PRENATAL VITAMIN PO) Take by mouth.   Taking     Review of Systems  All systems reviewed and negative except as stated in HPI  Physical Exam Blood pressure 101/65, pulse 62, temperature 98.4 F (36.9 C), temperature source Oral, resp. rate 19, height 5\' 2"  (1.575 m), weight 63.5 kg, last menstrual period 01/07/2018, SpO2 97 %, unknown if currently breastfeeding. General appearance: alert, oriented, NAD Lungs: normal respiratory effort Heart: regular rate Abdomen: soft, non-tender; gravid, FH appropriate for GA Extremities: No calf swelling or tenderness Presentation: cephalic Fetal monitoring: Fetal heart rate baseline 135 bpm with good variability Uterine activity: Contractions lasting approximaetly 2 minutes Dilation: 6 Effacement (%): 90 Station: -1 Exam by:: Casey Burkitt, RN  Prenatal labs: ABO, Rh: --/--/O POS (01/09 2019) Antibody: NEG (01/09 2019) Rubella: 5.31 (07/10  1136) RPR: Non Reactive (12/27 1156)  HBsAg: Negative (07/10 1136)  HIV: Non Reactive (12/27 1156)  GC/Chlamydia: Negative GBS: Negative (12/27 1151)  2-hr GTT: A1c 5.2%  Genetic screening: Low risk  Anatomy US: Normal   Prenatal Transfer Tool  Maternal Diabetes: No Genetic Screening: Normal  Maternal Ultrasounds/Referrals: Normal Fetal Ultrasounds or other Referrals:   Maternal Substance Abuse:  No Significant Maternal Medications:  None Significant Maternal Lab Results: None        Results for orders placed or performed during the hospital encounter of 09/24/18 (from the past 24 hour(s))  CBC   Collection Time: 09/24/18  8:19 PM  Result Value Ref Range   WBC 6.2 4.0 - 10.5 K/uL   RBC 3.70 (L) 3.87 - 5.11 MIL/uL   Hemoglobin 11.1 (L) 12.0 - 15.0 g/dL   HCT 06.2 (L) 69.4 - 85.4 %   MCV 90.3 80.0 - 100.0 fL   MCH 30.0 26.0 - 34.0 pg   MCHC 33.2 30.0 - 36.0 g/dL   RDW 62.7 03.5 - 00.9 %   Platelets 222 150 - 400 K/uL   nRBC 0.0 0.0 - 0.2 %  Type and screen   Collection Time: 09/24/18  8:19 PM  Result Value Ref Range   ABO/RH(D) O POS    Antibody Screen NEG    Sample Expiration      09/27/2018 Performed at [facility], [address], [place], [place] [address]         Patient Active Problem List   Diagnosis Date Noted  . Supervision of high risk pregnancy, antepartum 03/25/2018  . AMA (advanced maternal age) multigravida 35+ 03/25/2018  . H/O abnormal cervical Papanicolaou smear 03/25/2018  . Cervical polyp 03/25/2018  . History of IUFD 01/31/2014    Assessment: [name] is a 38 y.o. G4P2102 at [redacted]w[redacted]d here for SOL.   #Labor: Expectant management.  #Pain:  Controlled with fentanyl, will not plan to give more pain meds at this time. Patient declines epidural.  #FWB: Heart rate baseline 135 with good variability. Category I FHT.  #ID:      GBS negative #MOF: Both breast and bottle #MOC: IUD, and husband plans  to get vasectomy #Circ:   Does not plan on circumcision  Marcy Siren, D.O. [facility] Family Medicine Fellow, [facility] for [facility], [facility] 09/24/2018, 10:27 PM

## 2018-09-24 NOTE — Telephone Encounter (Signed)
Preadmission screen  

## 2018-09-24 NOTE — Discharge Instructions (Signed)

## 2018-09-24 NOTE — MAU Note (Signed)
Patient reports 4 contractions per hour.  No LOF.  Having some bleeding.  + FM.  Was 2 cm yesterday.

## 2018-09-24 NOTE — Anesthesia Pain Management Evaluation Note (Signed)
  CRNA Pain Management Visit Note  Patient: [name], 38 y.o., female  "Hello I am a member of the anesthesia team at [facility]. We have an anesthesia team available at all times to provide care throughout the hospital, including epidural management and anesthesia for C-section. I don't know your plan for the delivery whether it a natural birth, water birth, IV sedation, nitrous supplementation, doula or epidural, but we want to meet your pain goals."   1.Was your pain managed to your expectations on prior hospitalizations?   Yes   2.What is your expectation for pain management during this hospitalization?     Labor support without medications  3.How can we help you reach that goal? Be available, desires natural  Record the patient's initial score and the patient's pain goal.   Pain: 10  Pain Goal: 10 [facility] wants you to be able to say your pain was always managed very well.  [facility] 09/24/2018

## 2018-09-24 NOTE — Progress Notes (Signed)
I have reviewed this chart and agree with the RN/CMA assessment and management.    Berna Gitto C Monte Bronder, MD, FACOG Attending Physician, [facility]  

## 2018-09-24 NOTE — Progress Notes (Addendum)
LABOR AND DELIVERY ADMISSION HISTORY AND PHYSICAL NOTE  [name] is a 38 y.o. female [phone] with IUP at [redacted]w[redacted]d presenting for SOL. She reports positive fetal movement. She denies leakage of fluid, vaginal bleeding, and calf tenderness. She states that contractions started at 2AM and became regular by 5AM (8-70minutes apart). She does not plan on receiving an epidural. Husband is waiting outside due to caring for 18 year old child.   Prenatal History/Complications: [facility] Pregnancy complications: None   Past Medical History: Past Medical History:  Diagnosis Date  . Oligohydramnios   . Preterm labor     Past Surgical History: Past Surgical History:  Procedure Laterality Date  . NO PAST SURGERIES      Obstetrical History: OB History    Gravida  4   Para  3   Term  2   Preterm  1   AB  0   Living  2     SAB  0   TAB  0   Ectopic  0   Multiple  0   Live Births  2           Social History: Social History   Socioeconomic History  . Marital status: Married    Spouse name: Not on file  . Number of children: Not on file  . Years of education: Not on file  . Highest education level: Not on file  Occupational History  . Not on file  Social Needs  . Financial resource strain: Not on file  . Food insecurity:    Worry: Not on file    Inability: Not on file  . Transportation needs:    Medical: Not on file    Non-medical: Not on file  Tobacco Use  . Smoking status: Never Smoker  . Smokeless tobacco: Never Used  Substance and Sexual Activity  . Alcohol use: No  . Drug use: No  . Sexual activity: Not Currently    Birth control/protection: None  Lifestyle  . Physical activity:    Days per week: Not on file    Minutes per session: Not on file  . Stress: Not on file  Relationships  . Social connections:    Talks on phone: Not on file    Gets together: Not on file    Attends religious service: Not on  file    Active member of club or organization: Not on file    Attends meetings of clubs or organizations: Not on file    Relationship status: Not on file  Other Topics Concern  . Not on file  Social History Narrative  . Not on file    Family History: Family History  Problem Relation Age of Onset  . Cancer Mother   . Alcohol abuse Brother   . Asthma Brother   . Birth defects Brother   . Alcohol abuse Paternal Aunt   . Miscarriages / Stillbirths Maternal Grandmother   . Heart disease Maternal Grandfather     Allergies: No Known Allergies  Medications Prior to Admission  Medication Sig Dispense Refill Last Dose  . oseltamivir (TAMIFLU) 75 MG capsule Take 1 capsule (75 mg total) by mouth 2 (two) times daily for 5 days. 10 capsule 0 Taking  . Prenatal Vit-Fe Fumarate-FA (PRENATAL VITAMIN PO) Take by mouth.   Taking     Review of Systems  All systems reviewed and negative except as stated in HPI  Physical Exam Blood pressure 101/65, pulse  62, temperature 98.4 F (36.9 C), temperature source Oral, resp. rate 19, height 5\' 2"  (1.575 m), weight 63.5 kg, last menstrual period 01/07/2018, SpO2 97 %, unknown if currently breastfeeding. General appearance: alert, oriented, NAD Lungs: normal respiratory effort Heart: regular rate Abdomen: soft, non-tender; gravid, FH appropriate for GA Extremities: No calf swelling or tenderness Presentation: cephalic Fetal monitoring: Fetal heart rate baseline 135 bpm with good variability Uterine activity: Contractions lasting approximaetly 2 minutes Dilation: 6 Effacement (%): 90 Station: -1 Exam by:: Casey Burkitt. Hernandez, RN  Prenatal labs: ABO, Rh: --/--/O POS (01/09 2019) Antibody: NEG (01/09 2019) Rubella: 5.31 (07/10 1136) RPR: Non Reactive (12/27 1156)  HBsAg: Negative (07/10 1136)  HIV: Non Reactive (12/27 1156)  GC/Chlamydia: Negative GBS: Negative (12/27 1151)  2-hr GTT:  Genetic screening:  Anatomy US:  Prenatal Transfer Tool   Maternal Diabetes: No Genetic Screening:  Maternal Ultrasounds/Referrals: Normal Fetal Ultrasounds or other Referrals:   Maternal Substance Abuse:  No Significant Maternal Medications:  None Significant Maternal Lab Results: None  Results for orders placed or performed during the hospital encounter of 09/24/18 (from the past 24 hour(s))  CBC   Collection Time: 09/24/18  8:19 PM  Result Value Ref Range   WBC 6.2 4.0 - 10.5 K/uL   RBC 3.70 (L) 3.87 - 5.11 MIL/uL   Hemoglobin 11.1 (L) 12.0 - 15.0 g/dL   HCT 16.133.4 (L) 09.636.0 - 04.546.0 %   MCV 90.3 80.0 - 100.0 fL   MCH 30.0 26.0 - 34.0 pg   MCHC 33.2 30.0 - 36.0 g/dL   RDW 40.913.2 81.111.5 - 91.415.5 %   Platelets 222 150 - 400 K/uL   nRBC 0.0 0.0 - 0.2 %  Type and screen   Collection Time: 09/24/18  8:19 PM  Result Value Ref Range   ABO/RH(D) O POS    Antibody Screen NEG    Sample Expiration      09/27/2018 Performed at [facility], [address] [place], [place] [id number]     Patient Active Problem List   Diagnosis Date Noted  . Supervision of high risk pregnancy, antepartum 03/25/2018  . AMA (advanced maternal age) multigravida 35+ 03/25/2018  . H/O abnormal cervical Papanicolaou smear 03/25/2018  . Cervical polyp 03/25/2018  . History of IUFD 01/31/2014    Assessment: [name] is a 38 y.o. [id number] at 2935w2d here for Induction of labor.  #Labor: On Pitocin IV, 2.5 units/hr #Pain: Controlled with fentanyl, will not plan to give more pain meds at this time. #FWB: Heart rate baseline 135 with good variability #ID:  GBS negativ #MOF: Both breast and bottle #MOC: IUD, and husband plans to get vasectomy #Circ:  Does not plan on circumcision  [name] 09/24/2018, 9:38 PM

## 2018-09-25 ENCOUNTER — Encounter (HOSPITAL_COMMUNITY): Payer: Self-pay

## 2018-09-25 ENCOUNTER — Telehealth: Payer: Self-pay | Admitting: *Deleted

## 2018-09-25 DIAGNOSIS — Z3A38 38 weeks gestation of pregnancy: Secondary | ICD-10-CM

## 2018-09-25 LAB — RPR: RPR Ser Ql: NONREACTIVE

## 2018-09-25 LAB — INFLUENZA PANEL BY PCR (TYPE A & B)
Influenza A By PCR: NEGATIVE
Influenza B By PCR: NEGATIVE

## 2018-09-25 MED ORDER — BENZOCAINE-MENTHOL 20-0.5 % EX AERO
1.0000 "application " | INHALATION_SPRAY | CUTANEOUS | Status: DC | PRN
  Administered 2018-09-25: 1 via TOPICAL

## 2018-09-25 MED ORDER — SIMETHICONE 80 MG PO CHEW: 80.0000 mg | CHEWABLE_TABLET | ORAL | Status: DC | PRN

## 2018-09-25 MED ORDER — IBUPROFEN 600 MG PO TABS
600.0000 mg | ORAL_TABLET | Freq: Four times a day (QID) | ORAL | Status: DC
  Administered 2018-09-25 – 2018-09-26 (×6): 600 mg via ORAL
  Filled 2018-09-25 (×6): qty 1

## 2018-09-25 MED ORDER — PRENATAL MULTIVITAMIN CH
1.0000 | ORAL_TABLET | Freq: Every day | ORAL | Status: DC
  Administered 2018-09-25 – 2018-09-26 (×2): 1 via ORAL
  Filled 2018-09-25 (×2): qty 1

## 2018-09-25 MED ORDER — WITCH HAZEL-GLYCERIN EX PADS: 1.0000 "application " | MEDICATED_PAD | CUTANEOUS | Status: DC | PRN

## 2018-09-25 MED ORDER — ACETAMINOPHEN 325 MG PO TABS
650.0000 mg | ORAL_TABLET | ORAL | Status: DC | PRN
  Administered 2018-09-25: 650 mg via ORAL
  Filled 2018-09-25: qty 2

## 2018-09-25 MED ORDER — DIBUCAINE 1 % RE OINT: 1.0000 "application " | TOPICAL_OINTMENT | RECTAL | Status: DC | PRN

## 2018-09-25 MED ORDER — COCONUT OIL OIL
1.0000 "application " | TOPICAL_OIL | Status: DC | PRN
  Filled 2018-09-25: qty 120

## 2018-09-25 MED ORDER — SENNOSIDES-DOCUSATE SODIUM 8.6-50 MG PO TABS
2.0000 | ORAL_TABLET | ORAL | Status: DC
  Administered 2018-09-26: 2 via ORAL
  Filled 2018-09-25: qty 2

## 2018-09-25 MED ORDER — OXYCODONE HCL 5 MG PO TABS
5.0000 mg | ORAL_TABLET | ORAL | Status: DC | PRN
  Administered 2018-09-25 (×2): 5 mg via ORAL
  Filled 2018-09-25 (×3): qty 1

## 2018-09-25 MED ORDER — ONDANSETRON HCL 4 MG/2ML IJ SOLN: 4.0000 mg | INTRAMUSCULAR | Status: DC | PRN

## 2018-09-25 MED ORDER — TETANUS-DIPHTH-ACELL PERTUSSIS 5-2.5-18.5 LF-MCG/0.5 IM SUSP
0.5000 mL | Freq: Once | INTRAMUSCULAR | Status: AC
  Administered 2018-09-26: 0.5 mL via INTRAMUSCULAR

## 2018-09-25 MED ORDER — DIPHENHYDRAMINE HCL 25 MG PO CAPS: 25.0000 mg | ORAL_CAPSULE | Freq: Four times a day (QID) | ORAL | Status: DC | PRN

## 2018-09-25 MED ORDER — ONDANSETRON HCL 4 MG PO TABS: 4.0000 mg | ORAL_TABLET | ORAL | Status: DC | PRN

## 2018-09-25 MED ORDER — ZOLPIDEM TARTRATE 5 MG PO TABS: 5.0000 mg | ORAL_TABLET | Freq: Every evening | ORAL | Status: DC | PRN

## 2018-09-25 NOTE — Progress Notes (Signed)
CSW received consult for late and limited PNC.  CSW reviewed chart and is screening out consult as it does not meet criteria for automatic CSW involvement and infant drug screening.  MOB started care prior to 28 weeks and had 3 visits.  Please contact CSW if current concerns arise or by MOB's request.  [name], MSW, LCSW Clinical Social Work [phone] 

## 2018-09-25 NOTE — Progress Notes (Signed)
LABOR PROGRESS NOTE  [name] is a 38 y.o. [phone] at [redacted]w[redacted]d  admitted for SOL  Subjective: Doing well, reports contractions not as frequent   Objective: BP 97/64   Pulse 65   Temp 98.4 F (36.9 C) (Oral)   Resp 17   Ht 5\' 2"  (1.575 m)   Wt 63.5 kg   LMP 01/07/2018 (Approximate)   SpO2 97%   BMI 25.61 kg/m  or  Vitals:   09/24/18 2023 09/24/18 2100 09/24/18 2225 09/24/18 2329  BP: 104/66 101/65 104/66 97/64  Pulse: 69 62 69 65  Resp: 19 19 18 17   Temp:      TempSrc:      SpO2:      Weight:      Height:        0022 Dilation: 6 Effacement (%): 90 Cervical Position: Middle Station: -1 Presentation: Vertex Exam by:: Casey Burkitt, RN FHT: baseline rate 135, moderate varibility, +accel, no decel Toco: irregular every 2-9 minutes  AROM: light mec    Labs: Lab Results  Component Value Date   WBC 6.2 09/24/2018   HGB 11.1 (L) 09/24/2018   HCT 33.4 (L) 09/24/2018   MCV 90.3 09/24/2018   PLT 222 09/24/2018    Patient Active Problem List   Diagnosis Date Noted  . Supervision of high risk pregnancy, antepartum 03/25/2018  . AMA (advanced maternal age) multigravida 35+ 03/25/2018  . H/O abnormal cervical Papanicolaou smear 03/25/2018  . Cervical polyp 03/25/2018  . History of IUFD 01/31/2014    Assessment / Plan: 38 y.o. G4P2102 at [redacted]w[redacted]d here for SOL  Influenza PCR ordered; pt reports flu like sx with fever on Sunday.no fever currently. Husband is flu+.  I/D:gbs negative Labor: no cervical dilation since 1900, AROM'd.  Fetal Wellbeing:  Category 1  Pain Control:  IV pain medication Anticipated MOD:  SVD   09/25/2018, 12:19 AM

## 2018-09-25 NOTE — Telephone Encounter (Signed)
Pt called office yesterday and spoke with staff member Justus Memory. She requested a call back in regards to her induction date. Per chart review, pt delivered baby earlier today.  I called her to wish congratulations and she had no further requests.

## 2018-09-25 NOTE — Lactation Note (Signed)
This note was copied from a baby's chart. Lactation Consultation Note  Patient Name: [name] XAJOI'N Date: 09/25/2018 Reason for consult: Initial assessment;Early term 60-38.6wks  85 hour old early term female who is being partially BF and formula fed by his mother, she's a P4. First baby was still born, she BF her second one for 6 months and third one for 10 months. Mom plans to do both, breast and bottle for this baby, already supplementing with Similac 20 calorie formula. LC noticed that some of the bottle nipples were standard flow, advised mom to request slow flow nipples when supplementing with formula if she's also planning on BF. Mom didn't see very committed at this point, declined assistance with hand expression (she voiced "it wasn't for her" and also with latch.  She also voiced that "she didn't have enough" but per mom, she's been able to see colostrum on her nipples so far, reviewed Lactogenesis II and the importance of BF to fight off infections, FOB had flu symptoms that last two week, but mom has tested negative. Mom doesn't have a pump at home, but she also voiced she doesn't need one.  Feeding plan:  1. Encouraged mom to feed baby STS 8-12 times/24 hours or sooner if feeding cues are present 2. Mom will continue to supplement baby with Similac 20 calorie formula as her choice  BF brochure, BF resources and feeding diary were reviewed. Mom reported all questions and concerns were answered, she's aware of LC services and will call PRN.  Maternal Data Formula Feeding for Exclusion: Yes Reason for exclusion: Mother's choice to formula and breast feed on admission Has patient been taught Hand Expression?: No(she declined when LC offered, she said "it's not for me") Does the patient have breastfeeding experience prior to this delivery?: Yes  Feeding      Interventions Interventions: Breast feeding basics reviewed  Lactation Tools Discussed/Used WIC Program:  No   Consult Status Consult Status: PRN Date: 09/26/18 Follow-up type: In-patient    [name] 09/25/2018, 11:35 AM

## 2018-09-26 MED ORDER — INFLUENZA VAC SPLIT QUAD 0.5 ML IM SUSY
0.5000 mL | PREFILLED_SYRINGE | Freq: Once | INTRAMUSCULAR | Status: AC
  Administered 2018-09-26: 0.5 mL via INTRAMUSCULAR

## 2018-09-26 MED ORDER — IBUPROFEN 600 MG PO TABS: 600.0000 mg | ORAL_TABLET | Freq: Four times a day (QID) | ORAL | 0 refills | Status: AC

## 2018-09-26 NOTE — Discharge Summary (Addendum)
Postpartum Discharge Summary     Patient Name: [name] DOB: 10/07/1980 MRN: [id number]  Date of admission: 09/24/2018 Delivering Provider: Arvilla Market   Date of discharge: 09/26/2018  Admitting diagnosis: 37 WKS, CTX Intrauterine pregnancy: [redacted]w[redacted]d     Secondary diagnosis:  Active Problems:   History of IUFD   Supervision of high risk pregnancy, antepartum   AMA (advanced maternal age) multigravida 35+   H/O abnormal cervical Papanicolaou smear  Additional problems: none     Discharge diagnosis: Term Pregnancy Delivered                                                                                                Post partum procedures: none  Augmentation: AROM  Complications: None  Hospital course:  Onset of Labor With Vaginal Delivery     38 y.o. yo [phone] at [redacted]w[redacted]d was admitted in Active Labor on 09/24/2018. She had a flu PCR swab while in labor due to husband reportedly being flu positive and pt reporting symptoms on 09/20/18. Pt's result was negative. Patient had an uncomplicated labor course as follows:  Membrane Rupture Time/Date: 12:15 AM ,09/25/2018   Intrapartum Procedures: Episiotomy: None [1]                                         Lacerations:  Periurethral [8]  Patient had a delivery of a Viable infant. 09/25/2018  Information for the patient's newborn:  Aina Gorham, Boy Ivette [[id number]]  Delivery Method: Vag-Spont    Pateint had an uncomplicated postpartum course.  She is ambulating, tolerating a regular diet, passing flatus, and urinating well. Patient is discharged home in stable condition on 09/26/18 per her request for early discharge.   Magnesium Sulfate recieved: No BMZ received: No  Physical exam  Vitals:   09/25/18 1000 09/25/18 1400 09/25/18 2300 09/26/18 0550  BP: (!) 84/56 (!) 90/55 92/60 (!) 90/59  Pulse: 61 69 69 66  Resp: 16 16 18 18   Temp: 97.9 F (36.6 C) 98.9 F (37.2 C) 98.4 F (36.9 C) 98.3 F (36.8  C)  TempSrc: Oral Oral Oral Oral  SpO2:    99%  Weight:      Height:       General: alert Lochia: appropriate Uterine Fundus: firm Incision: n/a DVT Evaluation: No evidence of DVT seen on physical exam. Labs: Lab Results  Component Value Date   WBC 6.2 09/24/2018   HGB 11.1 (L) 09/24/2018   HCT 33.4 (L) 09/24/2018   MCV 90.3 09/24/2018   PLT 222 09/24/2018   CMP Latest Ref Rng & Units 02/27/2018  Glucose 65 - 99 mg/dL 99  BUN 6 - 20 mg/dL 12  Creatinine 2.83 - 6.62 mg/dL 9.47(M)  Sodium 546 - 503 mmol/L 137  Potassium 3.5 - 5.1 mmol/L 3.4(L)  Chloride 101 - 111 mmol/L 107  CO2 22 - 32 mmol/L 21(L)  Calcium 8.9 - 10.3 mg/dL 8.9  Total Protein 6.5 - 8.1 g/dL 7.4  Total Bilirubin  0.3 - 1.2 mg/dL 0.3  Alkaline Phos 38 - 126 U/L 39  AST 15 - 41 U/L 22  ALT 14 - 54 U/L 20    Discharge instruction: per After Visit Summary and "Baby and Me Booklet".  After visit meds:  Allergies as of 09/26/2018   No Known Allergies     Medication List    STOP taking these medications   oseltamivir 75 MG capsule Commonly known as:  TAMIFLU     TAKE these medications   ibuprofen 600 MG tablet Commonly known as:  ADVIL,MOTRIN Take 1 tablet (600 mg total) by mouth every 6 (six) hours.   PRENATAL VITAMIN PO Take by mouth.       Diet: routine diet  Activity: Advance as tolerated. Pelvic rest for 6 weeks.   Outpatient follow up:4 weeks Follow up Appt:No future appointments. Follow up Visit: Follow-up Information    [facility] Follow [facility] in 4 week(s).   Specialty:  Obstetrics and Gynecology Contact information: [address] [id number] [place] [id number] [phone]           Please schedule this patient for Postpartum visit in: 4 weeks with the following provider: Any provider For C/S patients schedule nurse incision check in weeks 2 weeks: no High risk pregnancy complicated by: AMA, hx iufd Delivery mode:  SVD Anticipated  Birth Control:  IUD PP Procedures needed: n/a  Schedule Integrated BH visit: no      Newborn Data: Live born female  Birth Weight: 6 lb 3.3 oz (2815 g) APGAR: 6, 9  Newborn Delivery   Birth date/time:  09/25/2018 02:07:00 Delivery type:  Vaginal, Spontaneous     Baby Feeding: Breast Disposition:home with mother   09/26/2018 Arabella MerlesKimberly D Terik Haughey, CNM  9:54 AM

## 2018-09-26 NOTE — Lactation Note (Signed)
This note was copied from a baby's chart. Lactation Consultation Note  Patient Name: [name] [id number] Date: 09/26/2018 Reason for consult: Follow-up assessment;Early term 24-38.6wks Mom is formula feeding.  She denies questions/concerns.  Lactation outpatient services reviewed and encouraged prn.  Maternal Data    Feeding    LATCH Score                   Interventions    Lactation Tools Discussed/Used     Consult Status Consult Status: Complete Follow-up type: Call as needed    Huston Foley 09/26/2018, 11:54 AM

## 2018-09-26 NOTE — Discharge Instructions (Signed)
Parto vaginal, cuidados posteriores °Vaginal Delivery, Care After °Siga estas instrucciones durante las próximas semanas. Estas indicaciones le proporcionan información acerca de cómo deberá cuidarse después del parto vaginal. Su médico también podrá darle indicaciones más específicas. El tratamiento ha sido planificado según las prácticas médicas actuales, pero en algunos casos pueden ocurrir problemas. Llame al médico si tiene problemas o preguntas. °¿Qué puedo esperar después del procedimiento? °Después de un parto vaginal, es frecuente tener lo siguiente: °· Hemorragia leve de la vagina. °· Dolor en el abdomen, la vagina y la zona de la piel entre la abertura vaginal y el ano (perineo). °· Calambres pélvicos. °· Fatiga. °Siga estas indicaciones en su casa: °Medicamentos °· Tome los medicamentos de venta libre y los recetados solamente como se lo haya indicado el médico. °· Si le recetaron un antibiótico, tómelo como se lo haya indicado el médico. No interrumpa la administración del antibiótico hasta que lo haya terminado. °Conducir ° °· No conduzca ni opere maquinaria pesada mientras toma analgésicos recetados. °· No conduzca durante 24 horas si le administraron un sedante. °Estilo de vida °· No beba alcohol. Esto es de suma importancia si está amamantando o toma analgésicos. °· No consuma productos que contengan tabaco, incluidos cigarrillos, tabaco de mascar o cigarrillos electrónicos. Si necesita ayuda para dejar de fumar, consulte al médico. °Qué debe comer y beber °· Beba al menos 8 vasos de ocho onzas (240 cc) de agua todos los días a menos que el médico le indique lo contrario. Si elige amamantar al bebé, quizá deba beber aún más cantidad de agua. °· Coma alimentos ricos en fibras todos los días. Estos alimentos pueden ayudarla a prevenir o aliviar el estreñimiento. Los alimentos ricos en fibras incluyen, entre otros: °? Panes y cereales integrales. °? Arroz integral. °? Frijoles. °? Frutas y verduras  frescas. °Actividad °· Retome sus actividades normales como se lo haya indicado el médico. Pregúntele al médico qué actividades son seguras para usted. °· Descanse todo lo que pueda. Trate de descansar o tomar una siesta mientras el bebé está durmiendo. °· No levante objetos que pesen más que su bebé o 10 libras (4,5 kg) hasta que el médico le diga que es seguro. °· Hable con el médico sobre cuándo puede retomar la actividad sexual. Esto puede depender de lo siguiente: °? Riesgo de sufrir una infección. °? Velocidad de cicatrización. °? Comodidad y deseo de retomar la actividad sexual. °Cuidados vaginales °· Si le realizaron una episiotomía o tuvo un desgarro vaginal, contrólese la zona todos los días para detectar signos de infección. Esté atenta a los siguientes signos: °? Aumento del enrojecimiento, la hinchazón o el dolor. °? Mayor presencia de líquido o sangre. °? Calor. °? Pus o mal olor. °· No use tampones ni se haga duchas vaginales hasta que el médico la autorice. °· Controle la sangre que elimina por la vagina para detectar coágulos de sangre. Estos pueden tener el aspecto de grumos de color rojo oscuro, o secreción marrón o negra. °Instrucciones generales °· Mantenga el perineo limpio y seco, como se lo haya indicado el médico. °· Use ropa cómoda y suelta. °· Cuando vaya al baño, siempre higienícese de adelante hacia atrás. °· Pregúntele al médico si puede ducharse o tomar baños de inmersión. Si se le realizó una episiotomía o tuvo un desgarro perineal durante el trabajo del parto o el parto, es posible que el médico le indique que no tome baños de inmersión durante un determinado tiempo. °· Use un sostén que sujete y ajuste bien sus pechos. °· Si   es posible, pídale a alguien que la ayude con las tareas del hogar y a cuidar del bebé durante al menos algunos días después de que le den el alta del hospital. °· Concurra a todas las visitas de seguimiento para usted y el bebé, como se lo haya indicado el  médico. Esto es importante. °Comuníquese con un médico si: °· Tiene los siguientes síntomas: °? Secreción vaginal que tiene mal olor. °? Dificultad para orinar. °? Dolor al orinar. °? Aumento o disminución repentinos de la frecuencia de las deposiciones. °? Más enrojecimiento, hinchazón o dolor alrededor de la episiotomía o del desgarro vaginal. °? Más secreción de líquido o sangre de la episiotomía o del desgarro vaginal. °? Pus o mal olor proveniente de la episiotomía o del desgarro vaginal. °? Fiebre. °? Erupción cutánea. °? Poco interés o falta de interés en actividades que solían gustarle. °? Dudas sobre su cuidado y el del bebé. °· Siente la episiotomía o el desgarro vaginal caliente al tacto. °· La episiotomía o el desgarro vaginal se abren o no parecen cicatrizar. °· Siente dolor en las mamas, o están duras o enrojecidas. °· Siente tristeza o preocupación de forma inusual. °· Siente náuseas o vomita. °· Elimina coágulos de sangre grandes por la vagina. Si expulsa un coágulo de sangre por la vagina, guárdelo para mostrárselo a su médico. No tire la cadena sin que el médico examine el coágulo de sangre antes. °· Orina más de lo habitual. °· Se siente mareada o se desmaya. °· No ha amamantado para nada y no ha tenido un período menstrual durante 12 semanas después del parto. °· Dejó de amamantar al bebé y no ha tenido su período menstrual durante 12 semanas después de dejar de amamantar. °Solicite ayuda de inmediato si: °· Tiene los siguientes síntomas: °? Dolor que no desaparece o no mejora con medicamentos. °? Dolor en el pecho. °? Dificultad para respirar. °? Visión borrosa o manchas en la vista. °? Pensamientos de autolesionarse o lesionar al bebé. °· Comienza a sentir dolor en el abdomen o en una de las piernas. °· Presenta un dolor de cabeza intenso. °· Se desmaya. °· Tiene una hemorragia de la vagina tan intensa que empapa dos toallitas sanitarias en una hora. °Esta información no tiene como fin  reemplazar el consejo del médico. Asegúrese de hacerle al médico cualquier pregunta que tenga. °Document Released: 09/02/2005 Document Revised: 12/25/2016 Document Reviewed: 09/17/2015 °Elsevier Interactive Patient Education © 2019 [employer] ° °

## 2018-09-29 ENCOUNTER — Inpatient Hospital Stay (HOSPITAL_COMMUNITY): Admission: RE | Admit: 2018-09-29 | Payer: Self-pay | Source: Ambulatory Visit

## 2018-11-02 ENCOUNTER — Encounter: Payer: Self-pay | Admitting: Internal Medicine

## 2018-11-02 ENCOUNTER — Telehealth: Payer: Self-pay | Admitting: Internal Medicine

## 2018-11-02 ENCOUNTER — Ambulatory Visit: Payer: Self-pay | Admitting: Internal Medicine

## 2018-11-02 NOTE — Telephone Encounter (Signed)
Called patient to reschedule missed appointment. Left a voicemail message and sending a reminder letter.

## 2018-12-04 ENCOUNTER — Encounter: Payer: Self-pay | Admitting: *Deleted

## 2018-12-08 ENCOUNTER — Telehealth: Payer: Self-pay | Admitting: Obstetrics & Gynecology

## 2018-12-08 NOTE — Telephone Encounter (Signed)
LVM for patient asking her to call the office back to clarify what injection she needed for work.

## 2018-12-08 NOTE — Telephone Encounter (Signed)
Patient called to say she needed an injection for work.

## 2018-12-14 ENCOUNTER — Telehealth: Payer: Self-pay | Admitting: Obstetrics and Gynecology

## 2018-12-14 NOTE — Telephone Encounter (Signed)
Returned pt's call regarding her immunizations.  Informed pt that she received tdap and flu on 09/26/2018.  Informed pt that she needs to contact her pcp or the health department to receive any other required vaccinations for her job.  Pt verbalized understanding.

## 2018-12-15 ENCOUNTER — Telehealth: Payer: Self-pay | Admitting: Student

## 2018-12-15 NOTE — Telephone Encounter (Signed)
The patient called in to gain access to any shot records we have on file. Walked the patient through how to setup my chart and gave the activation code. Also informed she may have the option to contact the local health department for further immunizations.

## 2019-01-04 ENCOUNTER — Telehealth: Payer: Self-pay | Admitting: *Deleted

## 2019-01-04 ENCOUNTER — Ambulatory Visit: Payer: Self-pay | Admitting: Obstetrics and Gynecology

## 2019-01-04 ENCOUNTER — Other Ambulatory Visit: Payer: Self-pay

## 2019-01-04 NOTE — Telephone Encounter (Signed)
3:55 I called [name]'s mobile number and left a message we are calling to complete your virtual visit. We will call again in a few minutes- please be available. I also called her home number and left the same message.  4:10 I called [name]'s mobile number again and left message we are calling to complete her virtual visit- since we have not been able to reach you- please call our office to reschedule.  4:15 I called [name]'s mobile number and left another message we are calling to complete her virtual visit and since we  Have not been able to reach you- please call our office to reschedule.

## 2020-01-31 IMAGING — US US OB < 14 WEEKS - US OB TV
1 series · 15 of 28 positions shown · non-contrast
Comparison: None.

CLINICAL DATA: Abdominal pain in pregnancy.

EXAM:
OBSTETRIC <14 WK US AND TRANSVAGINAL OB US
TECHNIQUE: Transvaginal ultrasound was performed for complete evaluation of the
gestation as well as the maternal uterus, adnexal regions, and
pelvic cul-de-sac.

[Series 1: us ob < 14 weeks - us ob tv · 91 acquisitions, 15 frames shown]
[im 1/91]
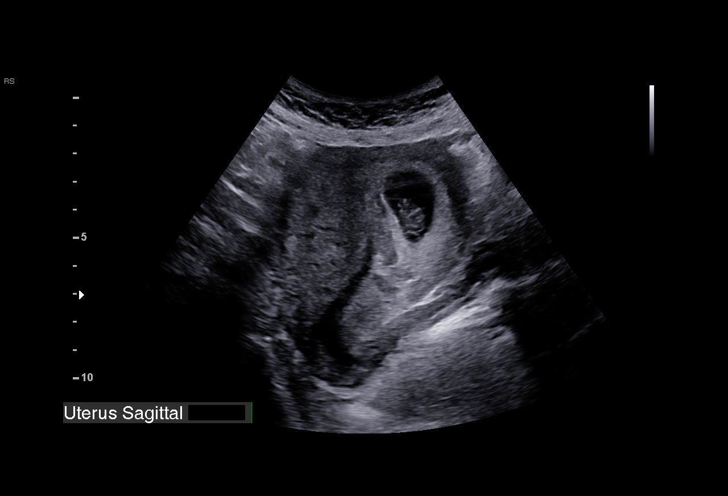
[im 7/91]
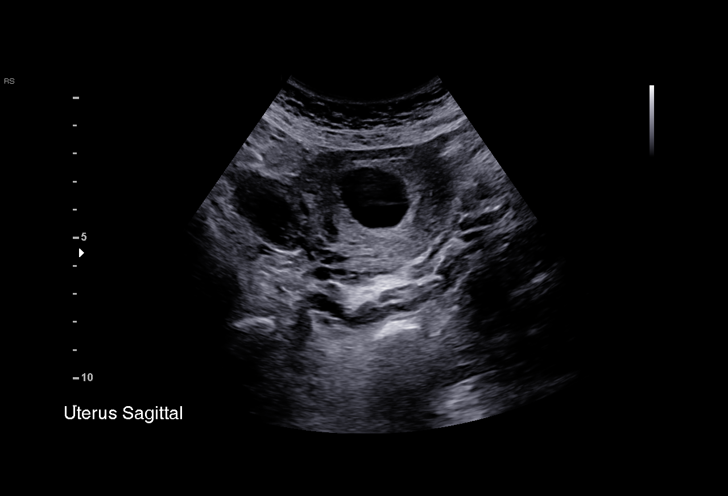
[im 14/91]
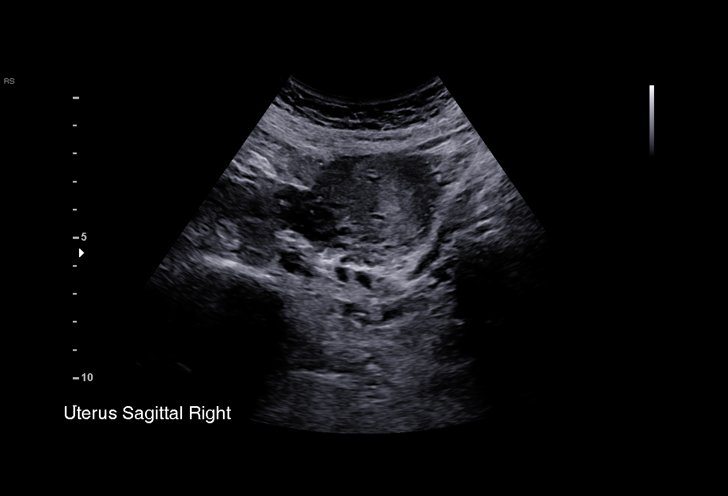
[im 21/91]
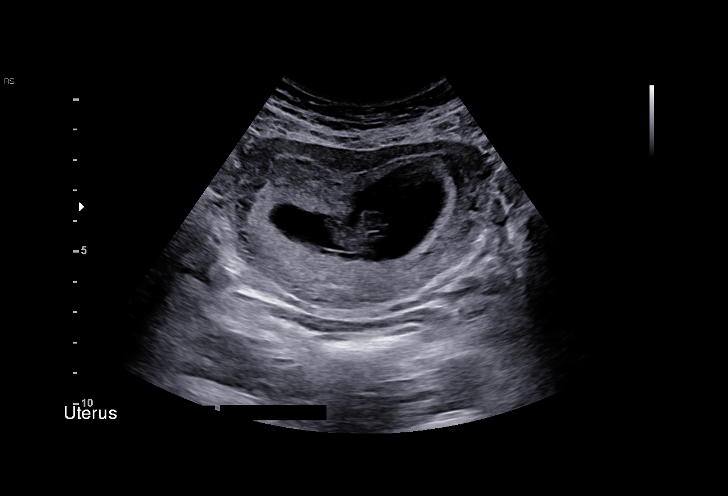
[im 27/91]
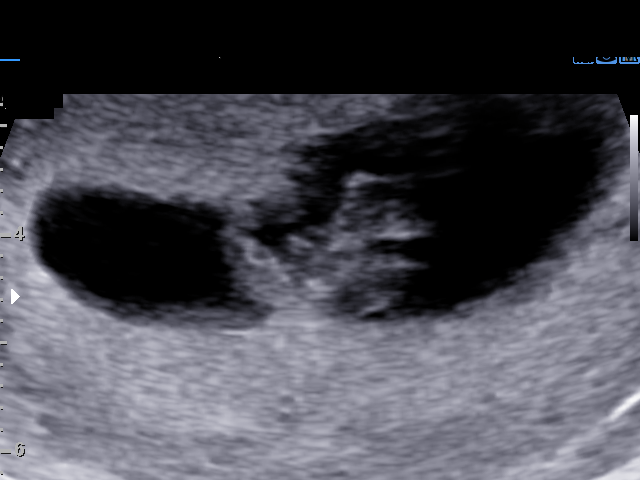
[im 34/91]
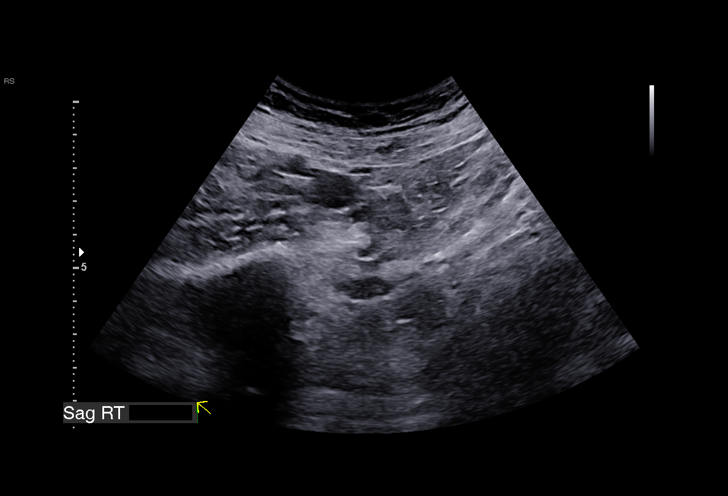
[im 41/91]
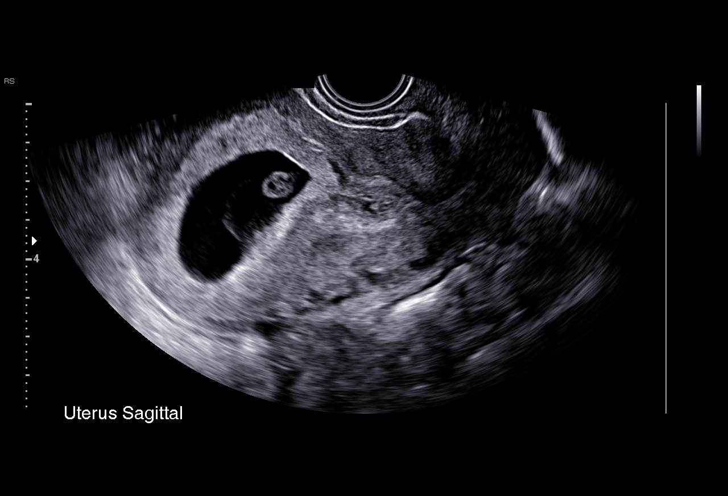
[im 47/91]
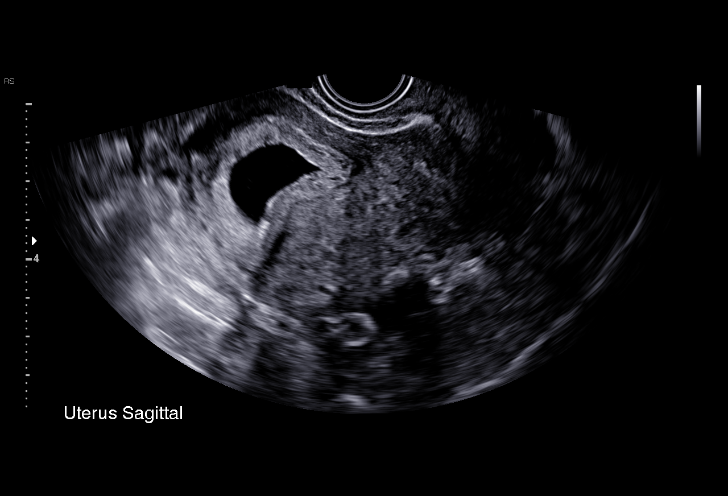
[im 51/91]
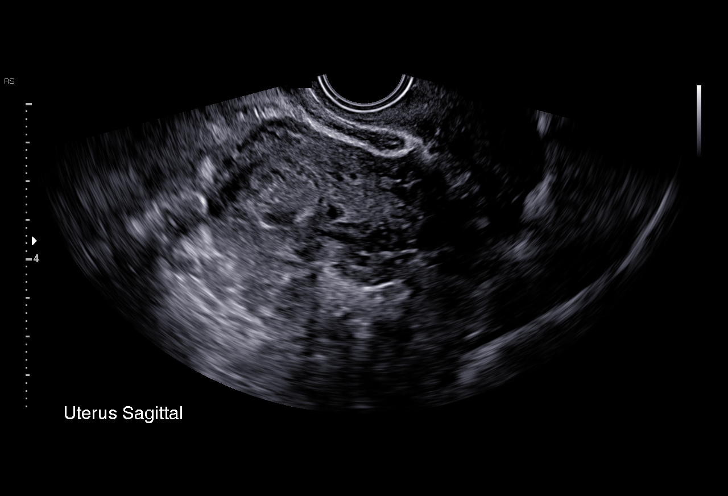
[im 57/91]
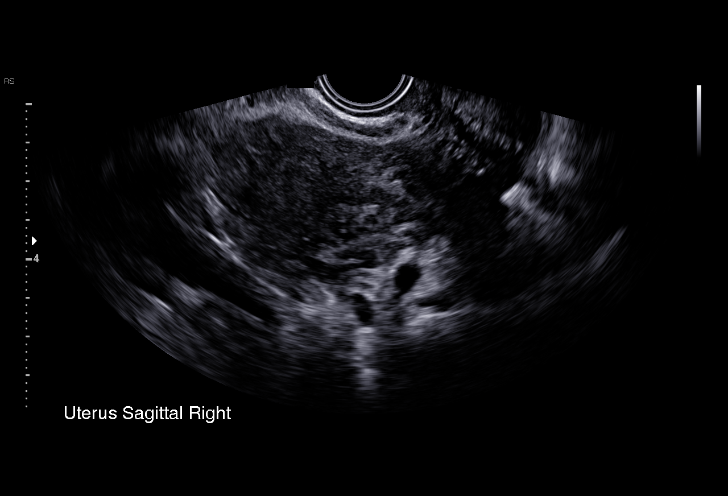
[im 64/91]
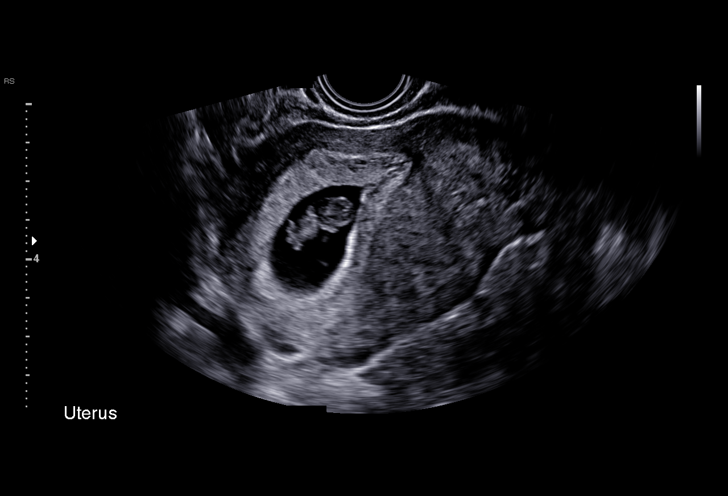
[im 71/91]
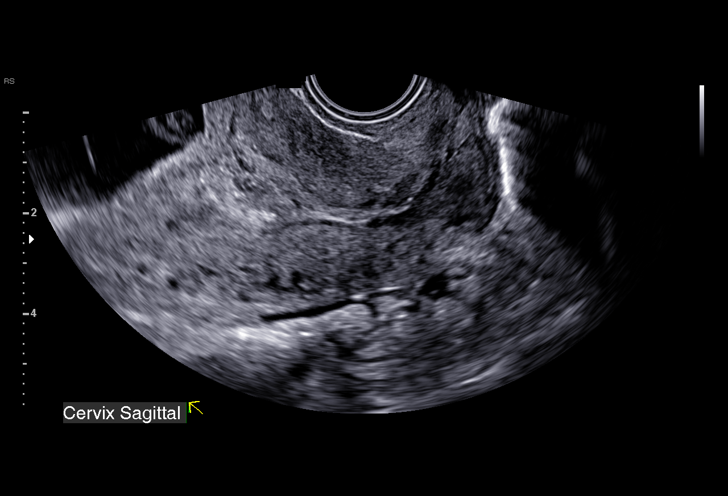
[im 77/91]
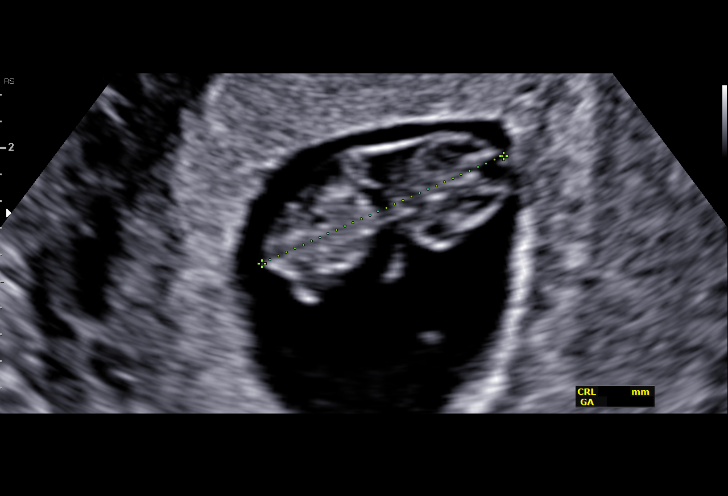
[im 84/91]
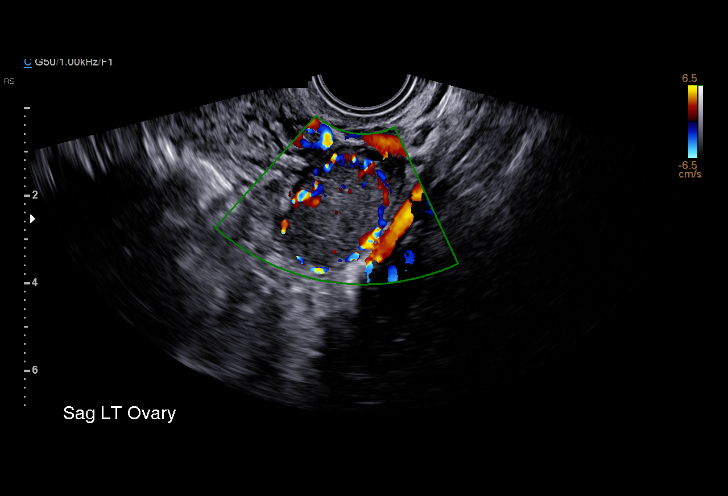
[im 91/91]
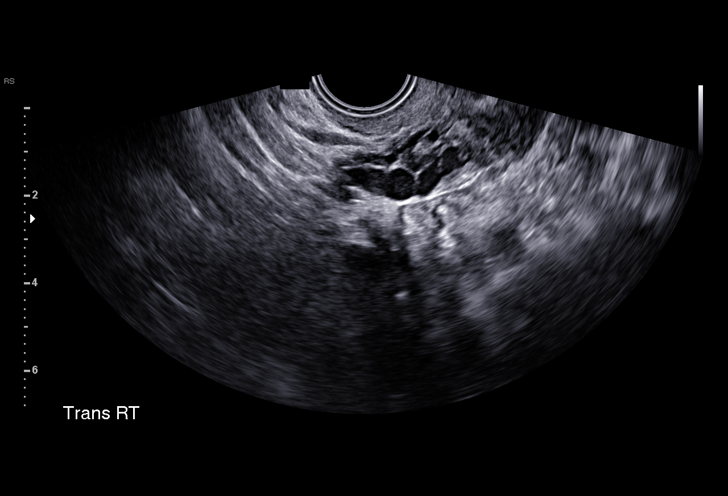

[15 of 28 positions shown; findings below may reference images not displayed]

FINDINGS: Intrauterine gestational sac: Single

Yolk sac:  Visualized.

Embryo:  Visualized.

Cardiac Activity: Visualized.

Heart Rate: 171 bpm

CRL:   19.42 mm   8 w 3 d                  US EDC: 10/06/2018

Maternal uterus/adnexae:

Subchorionic hemorrhage: Small measures 2.8 x 2.0 x 2.4 cm (volume =
7 cm^3)

Right ovary: Normal

Left ovary: Normal containing probable corpus luteum.

Other :None

Free fluid:  Trace
IMPRESSION: 1. Single living intrauterine gestation with an estimated
gestational age of 8 weeks and 3 days.
2. Small subchorionic hemorrhage identified with a volume of
approximately 7 cc.
